# Patient Record
Sex: Male | Born: 1985 | Race: White | Hispanic: No | Marital: Single | Smoking: Current every day smoker
Health system: Southern US, Community
[De-identification: ages and names within clinical notes are randomized; demographics above are authoritative.]

## PROBLEM LIST (undated history)

## (undated) ENCOUNTER — Emergency Department (HOSPITAL_COMMUNITY): Payer: Self-pay

---
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
## 2007-01-10 ENCOUNTER — Emergency Department: Payer: Self-pay | Admitting: Emergency Medicine

## 2008-09-05 ENCOUNTER — Emergency Department: Payer: Self-pay | Admitting: Emergency Medicine

## 2008-12-24 ENCOUNTER — Emergency Department: Payer: Self-pay | Admitting: Emergency Medicine

## 2009-04-15 ENCOUNTER — Emergency Department: Payer: Self-pay | Admitting: Emergency Medicine

## 2013-07-13 ENCOUNTER — Emergency Department: Payer: Self-pay | Admitting: Emergency Medicine

## 2013-07-13 LAB — BASIC METABOLIC PANEL
Anion Gap: 8 (ref 7–16)
Calcium, Total: 10.3 mg/dL — ABNORMAL HIGH (ref 8.5–10.1)
Chloride: 101 mmol/L (ref 98–107)
Co2: 26 mmol/L (ref 21–32)
Creatinine: 1.07 mg/dL (ref 0.60–1.30)
EGFR (African American): >60
Glucose: 96 mg/dL (ref 65–99)
Potassium: 2.9 mmol/L — ABNORMAL LOW (ref 3.5–5.1)
Sodium: 135 mmol/L — ABNORMAL LOW (ref 136–145)

## 2013-07-13 LAB — CBC
HGB: 16.1 g/dL (ref 13.0–18.0)
MCH: 29.8 pg (ref 26.0–34.0)
Platelet: 323 10*3/uL (ref 150–440)
RBC: 5.39 10*6/uL (ref 4.40–5.90)
RDW: 13.8 % (ref 11.5–14.5)

## 2013-07-13 LAB — URINALYSIS, COMPLETE
Bacteria: NONE SEEN
Bilirubin,UR: NEGATIVE
Ketone: NEGATIVE
Leukocyte Esterase: NEGATIVE
Nitrite: NEGATIVE
Protein: NEGATIVE
RBC,UR: 1 /HPF (ref 0–5)
Specific Gravity: 1.004 (ref 1.003–1.030)
Squamous Epithelial: NONE SEEN
WBC UR: 1 /HPF (ref 0–5)

## 2014-10-27 ENCOUNTER — Emergency Department: Payer: Self-pay | Admitting: Internal Medicine

## 2015-12-31 ENCOUNTER — Encounter: Payer: Self-pay | Admitting: Emergency Medicine

## 2015-12-31 ENCOUNTER — Emergency Department
Admission: EM | Admit: 2015-12-31 | Discharge: 2015-12-31 | Disposition: A | Discharge status: Home / Self Care | Payer: Self-pay | Attending: Emergency Medicine | Admitting: Emergency Medicine

## 2015-12-31 DIAGNOSIS — R319 Hematuria, unspecified: Secondary | ICD-10-CM | POA: Insufficient documentation

## 2015-12-31 LAB — URINALYSIS COMPLETE WITH MICROSCOPIC (ARMC ONLY)
BACTERIA UA: NONE SEEN
BILIRUBIN URINE: NEGATIVE
Glucose, UA: NEGATIVE mg/dL
Hgb urine dipstick: NEGATIVE
KETONES UR: NEGATIVE mg/dL
Leukocytes, UA: NEGATIVE
Nitrite: NEGATIVE
PH: 7 (ref 5.0–8.0)
PROTEIN: 30 mg/dL — AB
SPECIFIC GRAVITY, URINE: 1.034 — AB (ref 1.005–1.030)
SQUAMOUS EPITHELIAL / LPF: NONE SEEN

## 2015-12-31 LAB — COMPREHENSIVE METABOLIC PANEL
ALBUMIN: 5.1 g/dL — AB (ref 3.5–5.0)
ALT: 25 U/L (ref 17–63)
AST: 28 U/L (ref 15–41)
Alkaline Phosphatase: 51 U/L (ref 38–126)
Anion gap: 6 (ref 5–15)
BUN: 16 mg/dL (ref 6–20)
CHLORIDE: 104 mmol/L (ref 101–111)
CO2: 27 mmol/L (ref 22–32)
CREATININE: 0.98 mg/dL (ref 0.61–1.24)
Calcium: 9.8 mg/dL (ref 8.9–10.3)
GFR calc Af Amer: >60 mL/min (ref >60)
GFR calc non Af Amer: >60 mL/min (ref >60)
GLUCOSE: 113 mg/dL — AB (ref 65–99)
POTASSIUM: 3.9 mmol/L (ref 3.5–5.1)
Sodium: 137 mmol/L (ref 135–145)
Total Bilirubin: 1.1 mg/dL (ref 0.3–1.2)
Total Protein: 8.1 g/dL (ref 6.5–8.1)

## 2015-12-31 LAB — CBC
HEMATOCRIT: 45.7 % (ref 40.0–52.0)
Hemoglobin: 15.8 g/dL (ref 13.0–18.0)
MCH: 30.8 pg (ref 26.0–34.0)
MCHC: 34.6 g/dL (ref 32.0–36.0)
MCV: 89 fL (ref 80.0–100.0)
Platelets: 239 10*3/uL (ref 150–440)
RBC: 5.14 MIL/uL (ref 4.40–5.90)
RDW: 12.8 % (ref 11.5–14.5)
WBC: 8.7 10*3/uL (ref 3.8–10.6)

## 2015-12-31 LAB — LIPASE, BLOOD: LIPASE: 21 U/L (ref 11–51)

## 2015-12-31 NOTE — ED Notes (Signed)
Pt to ed with c/o abd pain, groin pain x 1 month.  Pt states last night noticed blood colored urine.

## 2015-12-31 NOTE — ED Notes (Signed)
Called to be taken to ED bed. No response.

## 2015-12-31 NOTE — ED Notes (Signed)
Called to room pt, no response. 

## 2018-02-24 ENCOUNTER — Emergency Department
Admission: EM | Admit: 2018-02-24 | Discharge: 2018-02-25 | Disposition: A | Discharge status: Other Acute Inpatient Hospital | Payer: Self-pay | Attending: Emergency Medicine | Admitting: Emergency Medicine

## 2018-02-24 ENCOUNTER — Encounter: Payer: Self-pay | Admitting: Emergency Medicine

## 2018-02-24 DIAGNOSIS — T1491XA Suicide attempt, initial encounter: Secondary | ICD-10-CM

## 2018-02-24 DIAGNOSIS — Z046 Encounter for general psychiatric examination, requested by authority: Secondary | ICD-10-CM | POA: Insufficient documentation

## 2018-02-24 DIAGNOSIS — Z7289 Other problems related to lifestyle: Secondary | ICD-10-CM | POA: Insufficient documentation

## 2018-02-24 DIAGNOSIS — F17200 Nicotine dependence, unspecified, uncomplicated: Secondary | ICD-10-CM | POA: Insufficient documentation

## 2018-02-24 DIAGNOSIS — F329 Major depressive disorder, single episode, unspecified: Secondary | ICD-10-CM | POA: Insufficient documentation

## 2018-02-24 DIAGNOSIS — F10920 Alcohol use, unspecified with intoxication, uncomplicated: Secondary | ICD-10-CM | POA: Insufficient documentation

## 2018-02-24 DIAGNOSIS — F322 Major depressive disorder, single episode, severe without psychotic features: Secondary | ICD-10-CM

## 2018-02-24 DIAGNOSIS — Y906 Blood alcohol level of 120-199 mg/100 ml: Secondary | ICD-10-CM | POA: Insufficient documentation

## 2018-02-24 DIAGNOSIS — R45851 Suicidal ideations: Secondary | ICD-10-CM | POA: Insufficient documentation

## 2018-02-24 DIAGNOSIS — F1010 Alcohol abuse, uncomplicated: Secondary | ICD-10-CM

## 2018-02-24 DIAGNOSIS — F1410 Cocaine abuse, uncomplicated: Secondary | ICD-10-CM

## 2018-02-24 NOTE — ED Provider Notes (Signed)
[facility] Emergency Department Provider Note   ____________________________________________   First MD Initiated Contact with Patient 02/24/18 2314     (approximate)  I have reviewed the triage vital signs and the nursing notes.   HISTORY  Chief Complaint Alcohol Intoxication (Pt. to ED with C/O SI and alcohol intoxication)    HPI James Mercado is a 32 y.o. male who comes into the hospital today stating that he does not want to be here.  He states the people leave him alone.  When I asked him if he tried to kill himself he states may be.  He reports that he tried to overdose on Percocet 2 to 3 days ago.  He reports that he took 60 pills.  He then states that tonight he drank too much and tried to strangle himself.  He reports that he did not drink a lot but then states that he drank all day.  He denies any drug use recently but he has used in the past.  He states that he is never tried to kill himself before and according to the nurse his suicide attempt was in relation to the patient's girlfriend breaking up with him.  He states that he does not want to be here.  He is here today for evaluation.  No past medical history on file.  The patient refuses to answer regarding his past medical and surgical history.  There are no active problems to display for this patient.   No past surgical history on file.  Prior to Admission medications   Not on File    Allergies Motrin [ibuprofen]  No family history on file.  Social History Social History   Tobacco Use  . Smoking status: Current Every Day Smoker  Substance Use Topics  . Alcohol use: Yes  . Drug use: Yes    Types: Marijuana    Review of Systems  Constitutional: No fever/chills Eyes: No visual changes. ENT: No sore throat. Cardiovascular: Denies chest pain. Respiratory: Denies shortness of breath. Gastrointestinal: No abdominal pain.  No nausea, no vomiting.  No diarrhea.  No  constipation. Genitourinary: Negative for dysuria. Musculoskeletal: Negative for back pain. Skin: Negative for rash. Neurological: Negative for headaches, focal weakness or numbness. Psychiatric:Flat affect and somnolent, suicidal ideation   ____________________________________________   PHYSICAL EXAM:  VITAL SIGNS: ED Triage Vitals  Enc Vitals Group     BP 02/24/18 2255 133/85     Pulse Rate 02/24/18 2255 88     Resp 02/24/18 2255 18     Temp 02/24/18 2315 98.2 F (36.8 C)     Temp Source 02/24/18 2315 Oral     SpO2 02/24/18 2255 98 %     Weight --      Height --      Head Circumference --      Peak Flow --      Pain Score --      Pain Loc --      Pain Edu? --      Excl. in GC? --     Constitutional: Somnolent but arousable, slow to answer questions. Well appearing and in mild distress. Eyes: Conjunctivae are normal. PERRL. EOMI. Head: Atraumatic. Nose: No congestion/rhinnorhea. Mouth/Throat: Mucous membranes are moist.  Oropharynx non-erythematous. Cardiovascular: Normal rate, regular rhythm. Grossly normal heart sounds.  Good peripheral circulation. Respiratory: Normal respiratory effort.  No retractions. Lungs CTAB. Gastrointestinal: Soft and nontender. No distention. Positive bowel sounds Musculoskeletal: No lower extremity tenderness nor edema.  Neurologic: Slurred speech, somnolent Skin:  Skin is warm, dry and intact. No rash noted. Psychiatric: Flat affect and depressed mood.  ____________________________________________   LABS (all labs ordered are listed, but only abnormal results are displayed)  Labs Reviewed  ACETAMINOPHEN LEVEL - Abnormal; Notable for the following components:      Result Value   Acetaminophen (Tylenol), Serum <10 (*)    All other components within normal limits  ETHANOL - Abnormal; Notable for the following components:   Alcohol, Ethyl (B) 161 (*)    All other components within normal limits  CBC - Abnormal; Notable for the  following components:   WBC 11.8 (*)    All other components within normal limits  URINE DRUG SCREEN, QUALITATIVE ([facility] ONLY) - Abnormal; Notable for the following components:   Cocaine Metabolite,Ur [place] POSITIVE (*)    Cannabinoid 50 Ng, Ur [place] POSITIVE (*)    Barbiturates, Ur Screen   (*)    Value: Result not available. Reagent lot number recalled by manufacturer.   All other components within normal limits  SALICYLATE LEVEL  COMPREHENSIVE METABOLIC PANEL   ____________________________________________  EKG  none ____________________________________________  RADIOLOGY  ED MD interpretation:  none  Official radiology report(s): No results found.  ____________________________________________   PROCEDURES  Procedure(s) performed: None  Procedures  Critical Care performed: No  ____________________________________________   INITIAL IMPRESSION / ASSESSMENT AND PLAN / ED COURSE  As part of my medical decision making, I reviewed the following data within the electronic MEDICAL RECORD NUMBER Notes from prior ED visits and [place] Controlled Substance Database   This is a 32 year old male who comes into the hospital today after trying to kill himself.  The patient tried to strangle himself after drinking.  He does not have any ligature marks around his neck.  He also reports that he took 60 Percocet 2 to 3 days ago.  We will check some blood work on the patient including a salicylate and acetaminophen level.  We will reassess the patient once I received all of his results and he will be evaluated by psych.  The patient's alcohol level is 161 but he does have some cocaine and marijuana in his urine.  The patient's remaining blood work is unremarkable.  His acetaminophen level is less than 10.  We will have the patient evaluated by psych and TTS.     ____________________________________________   FINAL CLINICAL IMPRESSION(S) / ED DIAGNOSES  Final diagnoses:  Alcoholic  intoxication without complication (HCC)  Suicidal ideation     ED Discharge Orders    None       Note:  This document was prepared using Dragon voice recognition software and may include unintentional dictation errors.    Rebecka Apley, MD 02/25/18 [phone]

## 2018-02-24 NOTE — ED Notes (Signed)
Unable to get oral temperature at this time.

## 2018-02-24 NOTE — ED Triage Notes (Signed)
Pt. Brought in by [facility] from behind a bar where he was found to have a belt around his neck. Patient states that his girlfriend dumped him and he wanted to hurt himself. Pt. Denies HI and AVH.

## 2018-02-25 ENCOUNTER — Other Ambulatory Visit: Payer: Self-pay

## 2018-02-25 ENCOUNTER — Inpatient Hospital Stay
Admission: AD | Admit: 2018-02-25 | Discharge: 2018-03-04 | DRG: 885 | Disposition: A | Discharge status: Home / Self Care | Payer: No Typology Code available for payment source | Attending: Psychiatry | Admitting: Psychiatry

## 2018-02-25 DIAGNOSIS — G4700 Insomnia, unspecified: Secondary | ICD-10-CM | POA: Diagnosis present

## 2018-02-25 DIAGNOSIS — F1010 Alcohol abuse, uncomplicated: Secondary | ICD-10-CM | POA: Diagnosis present

## 2018-02-25 DIAGNOSIS — F419 Anxiety disorder, unspecified: Secondary | ICD-10-CM | POA: Diagnosis present

## 2018-02-25 DIAGNOSIS — Z886 Allergy status to analgesic agent status: Secondary | ICD-10-CM | POA: Diagnosis not present

## 2018-02-25 DIAGNOSIS — F322 Major depressive disorder, single episode, severe without psychotic features: Secondary | ICD-10-CM

## 2018-02-25 DIAGNOSIS — T1491XA Suicide attempt, initial encounter: Secondary | ICD-10-CM

## 2018-02-25 DIAGNOSIS — F1290 Cannabis use, unspecified, uncomplicated: Secondary | ICD-10-CM | POA: Diagnosis present

## 2018-02-25 DIAGNOSIS — F1410 Cocaine abuse, uncomplicated: Secondary | ICD-10-CM

## 2018-02-25 DIAGNOSIS — F17200 Nicotine dependence, unspecified, uncomplicated: Secondary | ICD-10-CM | POA: Diagnosis present

## 2018-02-25 DIAGNOSIS — Y906 Blood alcohol level of 120-199 mg/100 ml: Secondary | ICD-10-CM | POA: Diagnosis present

## 2018-02-25 LAB — COMPREHENSIVE METABOLIC PANEL
ALT: 27 U/L (ref 0–44)
AST: 26 U/L (ref 15–41)
Albumin: 4.3 g/dL (ref 3.5–5.0)
Alkaline Phosphatase: 65 U/L (ref 38–126)
Anion gap: 10 (ref 5–15)
BILIRUBIN TOTAL: 0.5 mg/dL (ref 0.3–1.2)
BUN: 9 mg/dL (ref 6–20)
CALCIUM: 9.4 mg/dL (ref 8.9–10.3)
CO2: 24 mmol/L (ref 22–32)
CREATININE: 0.88 mg/dL (ref 0.61–1.24)
Chloride: 104 mmol/L (ref 98–111)
GFR calc non Af Amer: >60 mL/min (ref >60)
GLUCOSE: 93 mg/dL (ref 70–99)
Potassium: 3.5 mmol/L (ref 3.5–5.1)
SODIUM: 138 mmol/L (ref 135–145)
Total Protein: 7.7 g/dL (ref 6.5–8.1)

## 2018-02-25 LAB — URINE DRUG SCREEN, QUALITATIVE (ARMC ONLY)
Amphetamines, Ur Screen: NOT DETECTED
BENZODIAZEPINE, UR SCRN: NOT DETECTED
CANNABINOID 50 NG, UR ~~LOC~~: POSITIVE — AB
Cocaine Metabolite,Ur ~~LOC~~: POSITIVE — AB
MDMA (ECSTASY) UR SCREEN: NOT DETECTED
Methadone Scn, Ur: NOT DETECTED
Opiate, Ur Screen: NOT DETECTED
PHENCYCLIDINE (PCP) UR S: NOT DETECTED
TRICYCLIC, UR SCREEN: NOT DETECTED

## 2018-02-25 LAB — SALICYLATE LEVEL: Salicylate Lvl: <7 mg/dL (ref 2.8–30.0)

## 2018-02-25 LAB — CBC
HCT: 50.1 % (ref 40.0–52.0)
Hemoglobin: 17.3 g/dL (ref 13.0–18.0)
MCH: 31.9 pg (ref 26.0–34.0)
MCHC: 34.5 g/dL (ref 32.0–36.0)
MCV: 92.3 fL (ref 80.0–100.0)
PLATELETS: 289 10*3/uL (ref 150–440)
RBC: 5.42 MIL/uL (ref 4.40–5.90)
RDW: 13.4 % (ref 11.5–14.5)
WBC: 11.8 10*3/uL — ABNORMAL HIGH (ref 3.8–10.6)

## 2018-02-25 LAB — ACETAMINOPHEN LEVEL: Acetaminophen (Tylenol), Serum: <10 ug/mL — ABNORMAL LOW (ref 10–30)

## 2018-02-25 LAB — ETHANOL: Alcohol, Ethyl (B): 161 mg/dL — ABNORMAL HIGH (ref <10)

## 2018-02-25 MED ORDER — HYDROXYZINE HCL 50 MG PO TABS
50.0000 mg | ORAL_TABLET | Freq: Three times a day (TID) | ORAL | Status: DC | PRN
  Administered 2018-02-25 – 2018-03-03 (×5): 50 mg via ORAL
  Filled 2018-02-25 (×5): qty 1

## 2018-02-25 MED ORDER — ADULT MULTIVITAMIN W/MINERALS CH
1.0000 | ORAL_TABLET | Freq: Every day | ORAL | Status: DC
  Administered 2018-02-25: 1 via ORAL
  Filled 2018-02-25: qty 1

## 2018-02-25 MED ORDER — NICOTINE 21 MG/24HR TD PT24
21.0000 mg | MEDICATED_PATCH | Freq: Once | TRANSDERMAL | Status: DC
Start: 2018-02-25 — End: 2018-02-25
  Administered 2018-02-25: 21 mg via TRANSDERMAL

## 2018-02-25 MED ORDER — VITAMIN B-1 100 MG PO TABS
100.0000 mg | ORAL_TABLET | Freq: Every day | ORAL | Status: DC
  Administered 2018-02-25: 100 mg via ORAL
  Filled 2018-02-25: qty 1

## 2018-02-25 MED ORDER — FOLIC ACID 1 MG PO TABS
1.0000 mg | ORAL_TABLET | Freq: Every day | ORAL | Status: DC
  Administered 2018-02-26 – 2018-03-04 (×7): 1 mg via ORAL
  Filled 2018-02-25 (×7): qty 1

## 2018-02-25 MED ORDER — THIAMINE HCL 100 MG/ML IJ SOLN
100.0000 mg | Freq: Every day | INTRAMUSCULAR | Status: DC
  Filled 2018-02-25 (×7): qty 1

## 2018-02-25 MED ORDER — NICOTINE 21 MG/24HR TD PT24
MEDICATED_PATCH | TRANSDERMAL | Status: AC
  Administered 2018-02-25: 21 mg via TRANSDERMAL
  Filled 2018-02-25: qty 1

## 2018-02-25 MED ORDER — FOLIC ACID 1 MG PO TABS
1.0000 mg | ORAL_TABLET | Freq: Every day | ORAL | Status: DC
  Administered 2018-02-25: 1 mg via ORAL
  Filled 2018-02-25: qty 1

## 2018-02-25 MED ORDER — LORAZEPAM 2 MG/ML IJ SOLN: 1.0000 mg | Freq: Four times a day (QID) | INTRAMUSCULAR | Status: DC | PRN

## 2018-02-25 MED ORDER — LORAZEPAM 1 MG PO TABS: 1.0000 mg | ORAL_TABLET | Freq: Four times a day (QID) | ORAL | Status: AC | PRN

## 2018-02-25 MED ORDER — ADULT MULTIVITAMIN W/MINERALS CH
1.0000 | ORAL_TABLET | Freq: Every day | ORAL | Status: DC
Start: 2018-02-26 — End: 2018-03-04
  Administered 2018-02-26 – 2018-03-04 (×7): 1 via ORAL
  Filled 2018-02-25 (×7): qty 1

## 2018-02-25 MED ORDER — VITAMIN B-1 100 MG PO TABS
100.0000 mg | ORAL_TABLET | Freq: Every day | ORAL | Status: DC
Start: 2018-02-26 — End: 2018-03-04
  Administered 2018-02-26 – 2018-03-04 (×7): 100 mg via ORAL
  Filled 2018-02-25 (×7): qty 1

## 2018-02-25 MED ORDER — ALUM & MAG HYDROXIDE-SIMETH 200-200-20 MG/5ML PO SUSP: 30.0000 mL | ORAL | Status: DC | PRN

## 2018-02-25 MED ORDER — MAGNESIUM HYDROXIDE 400 MG/5ML PO SUSP: 30.0000 mL | Freq: Every day | ORAL | Status: DC | PRN

## 2018-02-25 MED ORDER — LORAZEPAM 2 MG/ML IJ SOLN: 1.0000 mg | Freq: Four times a day (QID) | INTRAMUSCULAR | Status: AC | PRN

## 2018-02-25 MED ORDER — TRAZODONE HCL 100 MG PO TABS
100.0000 mg | ORAL_TABLET | Freq: Every evening | ORAL | Status: DC | PRN
  Administered 2018-02-25 – 2018-02-26 (×2): 100 mg via ORAL
  Filled 2018-02-25 (×2): qty 1

## 2018-02-25 MED ORDER — ACETAMINOPHEN 325 MG PO TABS: 650.0000 mg | ORAL_TABLET | Freq: Four times a day (QID) | ORAL | Status: DC | PRN

## 2018-02-25 MED ORDER — LORAZEPAM 1 MG PO TABS: 1.0000 mg | ORAL_TABLET | Freq: Four times a day (QID) | ORAL | Status: DC | PRN

## 2018-02-25 MED ORDER — THIAMINE HCL 100 MG/ML IJ SOLN: 100.0000 mg | Freq: Every day | INTRAMUSCULAR | Status: DC

## 2018-02-25 NOTE — ED Notes (Signed)
Hourly rounding reveals patient sleeping in room. No complaints, stable, in no acute distress. Q15 minute rounds and monitoring via Rover and Officer to continue.  

## 2018-02-25 NOTE — ED Notes (Signed)
Calvin, TTS at bedside at this time. 2 gray earrings, and 1 gray eyebrow ring removed from patient. Placed in specimen cup at this time with patient label.

## 2018-02-25 NOTE — ED Notes (Signed)
Pt is up and walking around room.

## 2018-02-25 NOTE — Plan of Care (Signed)
New admission

## 2018-02-25 NOTE — Consult Note (Signed)
[facility] Consult   Reason for Consult: Consult for 32 year old man with a history of alcohol and cocaine abuse who came to the hospital after trying to kill himself Referring Physician: Jimmye Norman Patient Identification: James Mercado MRN:  [id number] Principal Diagnosis: Suicide attempt [facility]) Diagnosis:   Patient Active Problem List   Diagnosis Date Noted  . Suicide attempt ([place]) [T14.91XA] 02/25/2018  . Severe major depression, single episode, without psychotic features ([place]) [F32.2] 02/25/2018  . Alcohol abuse [F10.10] 02/25/2018  . Cocaine abuse ([place]) [F14.10] 02/25/2018    Total Time spent with patient: 1 hour  Subjective:   James Mercado is a 32 y.o. male patient admitted with "I tried to strangle myself".  HPI: Patient seen chart reviewed.  32 year old man brought in by emergency services after being found near a local bar with his belt wrapped around his neck.  Patient has been largely cooperative since coming to the emergency room.  He tells me that he tried to strangle himself yesterday.  He has only very vague memories of it.  He knows that he has felt like he is "tired of living" and that he "does not want to be here".  He admits that he was drinking heavily yesterday estimating his alcohol intake as being a case of beer and several drinks of liquor.  His alcohol level was only 161 on presentation.  He also admits to fairly regular use of cocaine and alcohol and marijuana.  Mood has been depressed for a few months.  Stays down and negative most of the time.  Isolated when he is not working.  Sleep is chronically very poor.  Appetite decreased.  Denies any hallucinations.  Major life stressor was his girlfriend leaving him a couple months ago.  Minimal to no real support system.  Medical history: Patient says he has had seizures in the past.  We cannot find any record in the computer of it right now although he has had ER visits in the past for which we lack  some documentation.  He claims that no one has ever been able to find a cause for the seizures and he is not on any medication for them.  Substance abuse history: Long-standing alcohol abuse.  Estimates that his usual daily consumption is at least a 12 pack.  Admits to regular use of cocaine and marijuana.  Acknowledges that it is a problem but has not been involved in treatment.  Social history: Patient is not willing to share much information about this.  He says he does not stay in touch with any of his family.  He had a girlfriend with 2 young children but she left him a couple months ago.  Claims to have no idea why this happened.  Mostly just lives with people with whom he works.  Past Psychiatric History: Patient has apparently never seen a psychiatrist or mental health provider before.  No hospitalizations.  He is vague as to whether he has ever tried to hurt himself in the past.  Never been prescribed any psychiatric medicine.  No known history of psychosis.  Risk to Self: Suicidal Ideation: Yes-Currently Present Suicidal Intent: Yes-Currently Present Is patient at risk for suicide?: Yes Suicidal Plan?: Yes-Currently Present Specify Current Suicidal Plan: Overdose and Strangle self Access to Means: Yes Specify Access to Suicidal Means: Belt What has been your use of drugs/alcohol within the last 12 months?: Alcohol, Cocaine & Cannabis How many times?: 2 Other Self Harm Risks: Active Addiction Triggers for Past Attempts:  None known Intentional Self Injurious Behavior: None Risk to Others: Homicidal Ideation: No Thoughts of Harm to Others: No Current Homicidal Intent: No Current Homicidal Plan: No Access to Homicidal Means: No Identified Victim: Reports of none History of harm to others?: No Assessment of Violence: None Noted Violent Behavior Description: Reports of none Does patient have access to weapons?: No Criminal Charges Pending?: No Does patient have a court date:  No Prior Inpatient Therapy: Prior Inpatient Therapy: No Prior Outpatient Therapy: Prior Outpatient Therapy: No Does patient have an ACCT team?: No Does patient have Intensive In-House Services?  : No Does patient have [facility] services? : No Does patient have P4CC services?: No  Past Medical History: No past medical history on file. No past surgical history on file. Family History: No family history on file. Family Psychiatric  History: Does not know of any Social History:  Social History   Substance and Sexual Activity  Alcohol Use Yes     Social History   Substance and Sexual Activity  Drug Use Yes  . Types: Marijuana    Social History   Socioeconomic History  . Marital status: Single    Spouse name: Not on file  . Number of children: Not on file  . Years of education: Not on file  . Highest education level: Not on file  Occupational History  . Not on file  Social Needs  . Financial resource strain: Not on file  . Food insecurity:    Worry: Not on file    Inability: Not on file  . Transportation needs:    Medical: Not on file    Non-medical: Not on file  Tobacco Use  . Smoking status: Current Every Day Smoker  Substance and Sexual Activity  . Alcohol use: Yes  . Drug use: Yes    Types: Marijuana  . Sexual activity: Not on file  Lifestyle  . Physical activity:    Days per week: Not on file    Minutes per session: Not on file  . Stress: Not on file  Relationships  . Social connections:    Talks on phone: Not on file    Gets together: Not on file    Attends religious service: Not on file    Active member of club or organization: Not on file    Attends meetings of clubs or organizations: Not on file    Relationship status: Not on file  Other Topics Concern  . Not on file  Social History Narrative  . Not on file   Additional Social History:    Allergies:   Allergies  Allergen Reactions  . Motrin [Ibuprofen] Rash    Labs:  Results for orders  placed or performed during the hospital encounter of 02/24/18 (from the past 48 hour(s))  Acetaminophen level     Status: Abnormal   Collection Time: 02/24/18 10:51 PM  Result Value Ref Range   Acetaminophen (Tylenol), Serum <10 (L) 10 - 30 ug/mL    Comment: (NOTE) Therapeutic concentrations vary significantly. A range of 10-30 ug/mL  may be an effective concentration for many patients. However, some  are best treated at concentrations outside of this range. Acetaminophen concentrations >150 ug/mL at 4 hours after ingestion  and >50 ug/mL at 12 hours after ingestion are often associated with  toxic reactions. Performed at [facility], [address] [place], [place] [address]   Salicylate level     Status: None   Collection Time: 02/24/18 10:51 PM  Result  Value Ref Range   Salicylate Lvl <4.1 2.8 - 30.0 mg/dL    Comment: Performed at [facility], [address] [place], [place] [address]  Comprehensive metabolic panel     Status: None   Collection Time: 02/24/18 10:51 PM  Result Value Ref Range   Sodium 138 135 - 145 mmol/L   Potassium 3.5 3.5 - 5.1 mmol/L   Chloride 104 98 - 111 mmol/L    Comment: Please note change in reference range.   CO2 24 22 - 32 mmol/L   Glucose, Bld 93 70 - 99 mg/dL    Comment: Please note change in reference range.   BUN 9 6 - 20 mg/dL    Comment: Please note change in reference range.   Creatinine, Ser 0.88 0.61 - 1.24 mg/dL   Calcium 9.4 8.9 - 10.3 mg/dL   Total Protein 7.7 6.5 - 8.1 g/dL   Albumin 4.3 3.5 - 5.0 g/dL   AST 26 15 - 41 U/L   ALT 27 0 - 44 U/L    Comment: Please note change in reference range.   Alkaline Phosphatase 65 38 - 126 U/L   Total Bilirubin 0.5 0.3 - 1.2 mg/dL   GFR calc non Af Amer >60 >60 mL/min   GFR calc Af Amer >60 >60 mL/min    Comment: (NOTE) The eGFR has been calculated using the CKD EPI equation. This calculation has not been validated in all clinical situations. eGFR's persistently <60  mL/min signify possible Chronic Kidney Disease.    Anion gap 10 5 - 15    Comment: Performed at [facility], [address] [place], [place] [address]  Ethanol     Status: Abnormal   Collection Time: 02/24/18 10:51 PM  Result Value Ref Range   Alcohol, Ethyl (B) 161 (H) <10 mg/dL    Comment: (NOTE) Lowest detectable limit for serum alcohol is 10 mg/dL. For medical purposes only. Performed at [facility], [address] [place], [place] [address]   cbc     Status: Abnormal   Collection Time: 02/24/18 10:51 PM  Result Value Ref Range   WBC 11.8 (H) 3.8 - 10.6 K/uL   RBC 5.42 4.40 - 5.90 MIL/uL   Hemoglobin 17.3 13.0 - 18.0 g/dL   HCT 50.1 40.0 - 52.0 %   MCV 92.3 80.0 - 100.0 fL   MCH 31.9 26.0 - 34.0 pg   MCHC 34.5 32.0 - 36.0 g/dL   RDW 13.4 11.5 - 14.5 %   Platelets 289 150 - 440 K/uL    Comment: Performed at [facility], [address] [place], [place] [address]  Urine Drug Screen, Qualitative     Status: Abnormal   Collection Time: 02/24/18 10:51 PM  Result Value Ref Range   Tricyclic, Ur Screen NONE DETECTED NONE DETECTED   Amphetamines, Ur Screen NONE DETECTED NONE DETECTED   MDMA (Ecstasy)Ur Screen NONE DETECTED NONE DETECTED   Cocaine Metabolite,Ur [place] POSITIVE (A) NONE DETECTED   Opiate, Ur Screen NONE DETECTED NONE DETECTED   Phencyclidine (PCP) Ur S NONE DETECTED NONE DETECTED   Cannabinoid 50 Ng, Ur [place] POSITIVE (A) NONE DETECTED   Barbiturates, Ur Screen (A) NONE DETECTED    Result not available. Reagent lot number recalled by manufacturer.   Benzodiazepine, Ur Scrn NONE DETECTED NONE DETECTED   Methadone Scn, Ur NONE DETECTED NONE DETECTED    Comment: (NOTE) Tricyclics + metabolites, urine    Cutoff 1000 ng/mL Amphetamines + metabolites,  urine  Cutoff 1000 ng/mL MDMA (Ecstasy), urine              Cutoff 500 ng/mL Cocaine Metabolite, urine          Cutoff 300 ng/mL Opiate + metabolites, urine        Cutoff 300  ng/mL Phencyclidine (PCP), urine         Cutoff 25 ng/mL Cannabinoid, urine                 Cutoff 50 ng/mL Barbiturates + metabolites, urine  Cutoff 200 ng/mL Benzodiazepine, urine              Cutoff 200 ng/mL Methadone, urine                   Cutoff 300 ng/mL The urine drug screen provides only a preliminary, unconfirmed analytical test result and should not be used for non-medical purposes. Clinical consideration and professional judgment should be applied to any positive drug screen result due to possible interfering substances. A more specific alternate chemical method must be used in order to obtain a confirmed analytical result. Gas chromatography / mass spectrometry (GC/MS) is the preferred confirmat ory method. Performed at [facility], [address] [place], [place] [address]     Current Facility-Administered Medications  Medication Dose Route Frequency Provider Last Rate Last Dose  . nicotine (NICODERM CQ - dosed in mg/24 hours) patch 21 mg  21 mg Transdermal Once Earleen Newport, MD   21 mg at 02/25/18 1102   No current outpatient medications on file.    Musculoskeletal: Strength & Muscle Tone: within normal limits Gait & Station: normal Patient leans: N/A  Psychiatric Specialty Exam: Physical Exam  Nursing note and vitals reviewed. Constitutional: He appears well-developed and well-nourished.  HENT:  Head: Normocephalic and atraumatic.  Eyes: Pupils are equal, round, and reactive to light. Conjunctivae are normal.  Neck: Normal range of motion.  Cardiovascular: Regular rhythm and normal heart sounds.  Respiratory: Effort normal. No respiratory distress.  GI: Soft.  Musculoskeletal: Normal range of motion.  Neurological: He is alert.  Skin: Skin is warm and dry.  Psychiatric: His affect is blunt. His speech is delayed. He is slowed. Thought content is not paranoid. Cognition and memory are impaired. He expresses impulsivity. He exhibits a  depressed mood. He expresses suicidal ideation. He expresses no homicidal ideation. He expresses no suicidal plans. He exhibits abnormal recent memory.    Review of Systems  Constitutional: Negative.   HENT: Negative.   Eyes: Negative.   Respiratory: Negative.   Cardiovascular: Negative.   Gastrointestinal: Negative.   Musculoskeletal: Negative.   Skin: Negative.   Neurological: Negative.   Psychiatric/Behavioral: Positive for depression, memory loss, substance abuse and suicidal ideas. The patient is nervous/anxious and has insomnia.     Blood pressure 133/85, pulse 88, temperature 98.2 F (36.8 C), temperature source Oral, resp. rate 18, SpO2 98 %.There is no height or weight on file to calculate BMI.  General Appearance: Disheveled  Eye Contact:  Fair  Speech:  Slow  Volume:  Decreased  Mood:  Dysphoric  Affect:  Depressed  Thought Process:  Coherent  Orientation:  Full (Time, Place, and Person)  Thought Content:  Logical  Suicidal Thoughts:  Yes.  with intent/plan  Homicidal Thoughts:  No  Memory:  Immediate;   Fair Recent;   Fair Remote;   Fair  Judgement:  Fair  Insight:  Fair  Psychomotor Activity:  Decreased  Concentration:  Concentration: Fair  Recall:  [employer] of Knowledge:  Fair  Language:  Fair  Akathisia:  No  Handed:  Right  AIMS (if indicated):     Assets:  Physical Health  ADL's:  Intact  Cognition:  Impaired,  Mild  Sleep:        Treatment Plan Summary: Plan 32 year old man who tried to strangle himself last night.  Continues to appear very depressed.  He is drinking heavily and has no social support and no history of any mental health treatment.  Very blunt and blank looking.  Patient will be admitted to the psychiatric ward because of high risk of self injury.  Detox protocol although at this point he is not showing any outward signs of alcohol withdrawal.  Full treatment team can assess appropriateness of medication treatment.  Continue  individual groups and counseling.  Case reviewed with ER doctor and TTS.  Disposition: Recommend psychiatric Inpatient admission when medically cleared. Supportive therapy provided about ongoing stressors.  Alethia Berthold, MD 02/25/2018 12:43 PM

## 2018-02-25 NOTE — BH Assessment (Signed)
[facility] by Dr. Toni Amendlapacs.  Attending Physician will be Dr. Flora Lipps'Neal.   Patient has been [facility] to room 306, [facility] Gwen.   ER staff is aware of the admission:  Glenda, ER Kathrynn SpeedSectary   Dr. Mayford KnifeWilliams, ER MD   Nicole Cellaorothy, Patient's Nurse   Ethelene BrownsAnthony, Patient Access.

## 2018-02-25 NOTE — ED Notes (Signed)
Discharge teaching done and pt verbalized understanding. Pt signed discharge form. Pt transported with all his personal belongings to behavioral med unit. Escorted by security in [place].

## 2018-02-25 NOTE — Tx Team (Signed)
Initial Treatment Plan 02/25/2018 6:29 PM James MilianPhillip Mercado [id number]    PATIENT STRESSORS: Loss of significant relationship Substance abuse   PATIENT STRENGTHS: Ability for insight Average or above average intelligence General fund of knowledge Motivation for treatment/growth Supportive family/friends   PATIENT IDENTIFIED PROBLEMS: Family issues   Substance abuse                   DISCHARGE CRITERIA:  Ability to meet basic life and health needs Improved stabilization in mood, thinking, and/or behavior Reduction of life-threatening or endangering symptoms to within safe limits  PRELIMINARY DISCHARGE PLAN: Return to previous living arrangement Return to previous work or school arrangements  PATIENT/FAMILY INVOLVEMENT: This treatment plan has been presented to and reviewed with the patient, James Mercado.  The patient family has been given the opportunity to ask questions and make suggestions.  Chamberlain Steinborn, RN 02/25/2018, 6:29 PM
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
## 2018-02-25 NOTE — ED Notes (Signed)
Pt was given water and ambulated to bathroom.

## 2018-02-25 NOTE — ED Notes (Signed)
Dr. Toni Amendlapacs to bedside at this time. Report given to Nicole Cellaorothy, Charity fundraiserN. Pt will be transferred after interview with Dr. Toni Amendlapacs pending evaluation.

## 2018-02-25 NOTE — ED Notes (Signed)
Pt asking if he can remove IV from arm. This tech ask pt to wait until we have orders to remove it. Pt agreed.

## 2018-02-25 NOTE — Plan of Care (Addendum)
Patient found in common area upon my arrival. Patient is visible and social this evening. Mood and affect are appropriate. Patient is new to unit so progress is minimal. Denies all complaints including pain and SI/HI/AVH. Processing is intact. Eye contact good. Denies S/Sx of WD. Cooperative and compliant with staff direction. Reports eating and voiding adequately. Patient has no scheduled HS medications. Given trazodone and vistaril for sleep with positive results. Q 15 minute checks maintained. Will continue to monitor throughout the shift. Patient slept 7 hours. No apparent distress. Will endorse care to oncoming shift.  Problem: Education: Goal: Knowledge of [facility] Education information/materials will improve Outcome: Progressing   Problem: Activity: Goal: Interest or engagement in activities will improve Outcome: Progressing
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
## 2018-02-25 NOTE — ED Notes (Signed)
Pt remains resting in bed at this time. Breakfast tray placed at bedside at this time by this RN. Will continue to monitor for further patient needs.

## 2018-02-25 NOTE — ED Notes (Signed)
[facility] Statistician[facility] and Officer Johnson. Pt calm and cooperative at this time.

## 2018-02-26 DIAGNOSIS — F322 Major depressive disorder, single episode, severe without psychotic features: Secondary | ICD-10-CM | POA: Diagnosis not present

## 2018-02-26 LAB — LIPID PANEL
CHOL/HDL RATIO: 2.3 ratio
CHOLESTEROL: 150 mg/dL (ref 0–200)
HDL: 65 mg/dL (ref >40)
LDL Cholesterol: 61 mg/dL (ref 0–99)
TRIGLYCERIDES: 118 mg/dL (ref <150)
VLDL: 24 mg/dL (ref 0–40)

## 2018-02-26 LAB — HEMOGLOBIN A1C
Hgb A1c MFr Bld: 5.6 % (ref 4.8–5.6)
MEAN PLASMA GLUCOSE: 114.02 mg/dL

## 2018-02-26 LAB — TSH: TSH: 3.128 u[IU]/mL (ref 0.350–4.500)

## 2018-02-26 MED ORDER — SERTRALINE HCL 25 MG PO TABS
50.0000 mg | ORAL_TABLET | Freq: Every day | ORAL | Status: DC
  Administered 2018-02-27 – 2018-03-04 (×6): 50 mg via ORAL
  Filled 2018-02-26 (×6): qty 2

## 2018-02-26 MED ORDER — NICOTINE 21 MG/24HR TD PT24
21.0000 mg | MEDICATED_PATCH | Freq: Every day | TRANSDERMAL | Status: DC
  Administered 2018-02-26 – 2018-03-04 (×7): 21 mg via TRANSDERMAL
  Filled 2018-02-26 (×7): qty 1

## 2018-02-26 MED ORDER — SERTRALINE HCL 25 MG PO TABS
25.0000 mg | ORAL_TABLET | Freq: Once | ORAL | Status: AC
  Administered 2018-02-26: 25 mg via ORAL
  Filled 2018-02-26: qty 1

## 2018-02-26 NOTE — Progress Notes (Signed)
Recreation Therapy Notes  Date: 02/26/2018  Time: 9:30 am  Location: Craft Room  Behavioral response: Appropriate    Intervention Topic: Goals  Discussion/Intervention:  Group content on today was focused on goals. Patients described what goals are and how they define goals. Individuals expressed how they go about setting goals and reaching them. The group identified how important goals are and if they make short term goals to reach long term goals. Patients described how many goals they work on at a time and what affects them not reaching their goal. Individuals described how much time they put into planning and obtaining their goals. The group participated in the intervention "My Goal Board" and made personal goal boards to help them achieve their goal.  Clinical Observations/Feedback:  Patient came to group late due to unknown reasons. Individual participated in the intervention and was social with peers and staff during group.  Nadyne Gariepy LRT/CTRS         Montasia Chisenhall 02/26/2018 11:32 AM
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
## 2018-02-26 NOTE — Plan of Care (Signed)
Pt. Verbalizes understanding of provided education. Pt. Compliant with medications and unit procedures. Pt. Monitored for safety by staff. Pt. Denies SI/Hi. Pt. Is able to verbally contract for safety.    Problem: Education: Goal: Knowledge of [place] [facility] Education information/materials will improve Outcome: Progressing   Problem: Health Behavior/Discharge Planning: Goal: Compliance with treatment plan for underlying cause of condition will improve Outcome: Progressing   Problem: Safety: Goal: Periods of time without injury will increase Outcome: Progressing

## 2018-02-26 NOTE — [facility] Suicide Risk Assessment (Signed)
[facility] Admission Suicide Risk Assessment   Nursing information obtained from:  Patient Demographic factors:  Male, Caucasian Current Mental Status:  NA Loss Factors:  Loss of significant relationship Historical Factors:  Prior suicide attempts Risk Reduction Factors:  Living with another person, especially a relative  Total Time spent with patient, discussing patient with treatment team, and reviewing his chart: 1 hour   Principal Problem: Severe major depression, single episode, without psychotic features (HCC) Diagnosis:   Patient Active Problem List   Diagnosis Date Noted  . Suicide attempt (HCC) [T14.91XA] 02/25/2018  . Severe major depression, single episode, without psychotic features (HCC) [F32.2] 02/25/2018  . Alcohol abuse [F10.10] 02/25/2018  . Cocaine abuse [facility]) [F14.10] 02/25/2018   Subjective Data:  Patient is a 32 year old Caucasian male with a history of substance abuse and depression who presents after suicide attempt via strangulation.  Patient states that for the past 2 months he has been feeling depressed.  He endorses feelings of hopelessness, guilt, helplessness, and worthlessness.  Patient recalls on 02/24/2018, walking to a bar and drinking alcohol until the point of intoxication.  He states that he was feeling down and had thoughts that he "no longer wanted to live."  He recalls taking his belt from around his waist and tying it around his neck.  Patient states he members passing out, and eventually waking up with someone around him.  He admits that it was a suicide attempt.  Per ED records, the patient also tried to overdose on Percocet 2-3 days prior to his strangulation attempt.   Patient continues to endorse depressive thoughts and symptoms.  He reports that he drinks approximately 12-24 beers per day for the past year.  He states that about 1-2 weeks ago his girlfriend left him without warning.  He has attempted to call her multiple times but has been unable to  reach her.  He states that he and his girlfriend were together for 8 months.  Patient also states that he travels for his job in [place].  He is hoping that one day he will be able to settle down in one town.  Patient denies ever being hospitalized in a psychiatric unit or detox unit.  He denies a history of delirium tremens.  Urine drug screen was positive for cocaine and marijuana.  He admits to smoking marijuana and snorting cocaine prior to admission.  He states that typically he only snorts cocaine about 3 times a year but he smokes marijuana approximately once a month.  Blood alcohol level was 161.  Patient is open to treatment for his substance abuse and depression.  He has never been on any psychotropic medications.  However, patient is open to an antidepressant.     Continued Clinical Symptoms:  Alcohol Use Disorder Identification Test Final Score (AUDIT): 14 The "Alcohol Use Disorders Identification Test", Guidelines for Use in Primary Care, Second Edition.  World Science writer [facility]). Score between 0-7:  no or low risk or alcohol related problems. Score between 8-15:  moderate risk of alcohol related problems. Score between 16-19:  high risk of alcohol related problems. Score 20 or above:  warrants further diagnostic evaluation for alcohol dependence and treatment.   CLINICAL FACTORS:   Depression:   Comorbid alcohol abuse/dependence Hopelessness Impulsivity Insomnia Severe Alcohol/Substance Abuse/Dependencies   Musculoskeletal: Strength & Muscle Tone: within normal limits Gait & Station: normal Patient leans: normal stance  Psychiatric Specialty Exam: Physical Exam  Nursing note and vitals reviewed.    Physical Exam  Nursing note and vitals reviewed. Constitutional: He is oriented to person, place, and time. He appears well-developed and well-nourished.  HENT:  Head: Normocephalic and atraumatic.  Eyes: Pupils are equal, round, and reactive to light.   Cardiovascular: Normal rate and regular rhythm.  Respiratory: Effort normal and breath sounds normal.  GI: Soft. Bowel sounds are normal.  Musculoskeletal: Normal range of motion.  Neurological: He is alert and oriented to person, place, and time.    Review of Systems  Constitutional: Negative for chills, diaphoresis, fever and weight loss.  HENT: Negative for congestion, sinus pain and sore throat.        Runny nose  Eyes: Negative for blurred vision and pain.  Respiratory: Negative for cough, shortness of breath and wheezing.   Cardiovascular: Negative for chest pain and leg swelling.  Gastrointestinal: Negative for abdominal pain, constipation, diarrhea, heartburn, nausea and vomiting.  Genitourinary: Negative for dysuria.  Musculoskeletal: Negative for myalgias.  Skin: Negative for rash.  Neurological: Negative for dizziness, tingling, tremors, seizures, loss of consciousness, weakness and headaches.  Psychiatric/Behavioral: Positive for depression, substance abuse and suicidal ideas. Negative for hallucinations and memory loss. The patient has insomnia. The patient is not nervous/anxious.     Blood pressure 136/90, pulse 88, temperature 97.7 F (36.5 C), temperature source Oral, resp. rate 20, height 6\' 2"  (1.88 m), weight 77.6 kg (171 lb), SpO2 100 %.Body mass index is 21.96 kg/m.  General Appearance: Fairly Groomed and Couple tattoos on his bilateral upper and lower extremities, chest, abdomen, shoulders and back  Eye Contact:  Fair  Speech:  Normal Rate  Volume:  Normal  Mood:  Depressed  Affect:  Flat  Thought Process:  Linear  Orientation:  Full (Time, Place, and Person)  Thought Content:  Logical  Suicidal Thoughts:  Yes.  with intent/plan leading to admission; however, pt denies active thoughts during assessment.  He contracts for safety  Homicidal Thoughts:  No  Memory:  intact  Judgement:  Poor  Insight:  Present  Psychomotor Activity:  Decreased   Concentration:  Concentration: Fair  Recall:  Fair  Fund of Knowledge:  Fair  Language:  Good  Akathisia:  No  Handed:  Right  AIMS (if indicated):   N/A  Assets:  Communication Skills Desire for Improvement Housing Resilience Transportation  ADL's:  Intact  Cognition:  WNL  Sleep:  Number of Hours: 7       COGNITIVE FEATURES THAT CONTRIBUTE TO RISK:  None    SUICIDE RISK:   Severe:  Frequent, intense, and enduring suicidal ideation, specific plan, no subjective intent, but some objective markers of intent (i.e., choice of lethal method), the method is accessible, some limited preparatory behavior, evidence of impaired self-control, severe dysphoria/symptomatology, multiple risk factors present, and few if any protective factors, particularly a lack of social support.  Risk factors: Suicide attempts, depressed mood, hopelessness, recent breakup with girlfriend, lack of familial support, substance abuse Protective factors: Willingness to seek treatment Pt's suicide risk is low while in the hospital due to patient receiving treatment. Also he is denying active suicidal thoughts and contracts for safety.  However, chronic suicide risk is high given patient's recent suicide attempts, substance abuse, loss of relationship/break-up, and depression.     Treatment Plan Summary: Daily contact with patient to assess and evaluate symptoms and progress in treatment, Medication management and Plan:  32 year old male admitted for substance abuse issues, depression, and suicide attempt via strangulation.  Patient has never been treated for depression.  He is open to antidepressant.  Patient also admits to drinking alcohol in excess for the past year.  He reports he drinks 12-24 beers per day for the past year.  Patient denies a history of delirium tremens.  Patient also endorses use of cocaine and intermittent use of opiates.  Patient also smokes marijuana.  Patient has never been in inpatient  or outpatient treatment for illicit drug use or alcohol abuse, other than going to AA meetings with his friends.  Major depressive disorder, severe, without psychotic features-initiate Zoloft 25 mg x1 dose, increase to Zoloft 50 mg if no issues at lower dose.  Risk (including but not limited to serotonin syndrome, allergic reaction, anaphylaxis, death) and benefits discussed with patient he consents to the medication.   Alcohol use disorder, moderate to severe; cocaine use disorder, moderate; cannabis use disorder, moderate; opioid use disorder, mild -CIWA protocol with PRN Ativan -Return for any withdrawal symptoms -Counseling provided; abstinence encouraged; will ask [facility] liaison to talk with patient      Observation Level/Precautions:  15 minute checks  Laboratory:  CBC, reviewed Chemistry Profile, within normal limits TSH 3.128 HbAIC 5.6 UDS + cocaine, THC Blood alcohol level 161 Lipid Panel total cholesterol 150, HDL 65, LDL 61, triglycerides 118 EKG-ordered  Psychotherapy: Group  Medications: See above  Consultations: N/A  Discharge Concerns: We will need follow-up for substance abuse treatment and outpatient psychiatric treatment for depression, medication management  Estimated LOS: 5-7 days       I certify that inpatient services furnished can reasonably be expected to improve the patient's condition.   Hessie KnowsSarita O'Neal, MD 02/26/2018, 6:04 PM

## 2018-02-26 NOTE — Progress Notes (Signed)
Recreation Therapy Notes  Date: 02/26/2018  Time: 3:00pm  Location: Craft room  Behavioral response: N/A  Group Type: Game  Participation level: N/A  Communication: Patient was metting with the Doctor.  Comments: N/A  Sky Primo LRT/CTRS        Trinika Cortese 02/26/2018 3:57 PM

## 2018-02-26 NOTE — Plan of Care (Addendum)
Patient found in common area upon my arrival. Patient is visible and social this evening. Patient is in good spirits this evening. Cooperative and polite. Denies all complaints including SI/HI/AVH. Reports eating and voiding adequately. Given Trazodone and Vistaril for sleep with positive results. Patient has no scheduled HS medications. Compliant with staff direction. Attended group. Q 15 minute checks maintained. Will continue to monitor throughout the shift. Patient slept 5.5 hours. Reports "I didn't sleep good at all." Will endorse care to oncoming shift.  Problem: Education: Goal: Knowledge of [facility] Education information/materials will improve Outcome: Progressing Goal: Emotional status will improve Outcome: Progressing Goal: Mental status will improve Outcome: Progressing Goal: Verbalization of understanding the information provided will improve Outcome: Progressing   Problem: Activity: Goal: Interest or engagement in activities will improve Outcome: Progressing Goal: Sleeping patterns will improve Outcome: Progressing   Problem: Education: Goal: Knowledge of General Education information will improve Outcome: Progressing

## 2018-02-26 NOTE — Progress Notes (Signed)
D: Pt denies SI/HI/AVH. Pt is pleasant and cooperative. Pt. has no Complaints.  Patient interaction minimal, but interacts well with peers. Pt. Reports good insight into why he is here.   A: Q x 15 minute observation checks were completed for safety. Patient was provided with education.  Patient was given medications per MD orders. Patient  was encourage to attend groups, participate in unit activities and continue with plan of care. Pt. Chart and plans of care reviewed. Pt. Given support and encouragement.   R: Patient is complaint with medication and unit procedures. Pt. Attends groups and unit activities. Pt. Monitored with CIWA per MD orders. Pt. Compliant with EKG testing with copy placed on chart for MD review.             Precautionary checks every 15 minutes for safety maintained, room free of safety hazards, patient sustains no injury or falls during this shift.
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
## 2018-02-26 NOTE — Tx Team (Addendum)
Interdisciplinary Treatment and Diagnostic Plan Update  02/26/2018 Time of Session: 1100 James Mercado MRN: [id number]  Principal Diagnosis: <principal problem not specified>  Secondary Diagnoses: Active Problems:   Severe major depression, single episode, without psychotic features (HCC)   Current Medications:  Current Facility-Administered Medications  Medication Dose Route Frequency Provider Last Rate Last Dose  . acetaminophen (TYLENOL) tablet 650 mg  650 mg Oral Q6H PRN Clapacs, John T, MD      . alum & mag hydroxide-simeth (MAALOX/MYLANTA) 200-200-20 MG/5ML suspension 30 mL  30 mL Oral Q4H PRN Clapacs, John T, MD      . folic acid (FOLVITE) tablet 1 mg  1 mg Oral Daily Clapacs, Jackquline Denmark, MD   1 mg at 02/26/18 0858  . hydrOXYzine (ATARAX/VISTARIL) tablet 50 mg  50 mg Oral TID PRN Clapacs, Jackquline Denmark, MD   50 mg at 02/25/18 2114  . LORazepam (ATIVAN) tablet 1 mg  1 mg Oral Q6H PRN Clapacs, Jackquline Denmark, MD       Or  . LORazepam (ATIVAN) injection 1 mg  1 mg Intravenous Q6H PRN Clapacs, John T, MD      . magnesium hydroxide (MILK OF MAGNESIA) suspension 30 mL  30 mL Oral Daily PRN Clapacs, John T, MD      . multivitamin with minerals tablet 1 tablet  1 tablet Oral Daily Clapacs, Jackquline Denmark, MD   1 tablet at 02/26/18 0858  . nicotine (NICODERM CQ - dosed in mg/24 hours) patch 21 mg  21 mg Transdermal Daily Hessie Knows, MD   21 mg at 02/26/18 0906  . thiamine (VITAMIN B-1) tablet 100 mg  100 mg Oral Daily Clapacs, John T, MD   100 mg at 02/26/18 4098   Or  . thiamine (B-1) injection 100 mg  100 mg Intravenous Daily Clapacs, John T, MD      . traZODone (DESYREL) tablet 100 mg  100 mg Oral QHS PRN Clapacs, Jackquline Denmark, MD   100 mg at 02/25/18 2114   PTA Medications: No medications prior to admission.    Patient Stressors: Loss of significant relationship Substance abuse  Patient Strengths: Ability for insight Average or above average intelligence General fund of knowledge Motivation for  treatment/growth Supportive family/friends  Treatment Modalities: Medication Management, Group therapy, Case management,  1 to 1 session with clinician, Psychoeducation, Recreational therapy.   Physician Treatment Plan for Primary Diagnosis: <principal problem not specified> Long Term Goal(s):     Short Term Goals:    Medication Management: Evaluate patient's response, side effects, and tolerance of medication regimen.  Therapeutic Interventions: 1 to 1 sessions, Unit Group sessions and Medication administration.  Evaluation of Outcomes: Progressing  Physician Treatment Plan for Secondary Diagnosis: Active Problems:   Severe major depression, single episode, without psychotic features (HCC)  Long Term Goal(s):     Short Term Goals:       Medication Management: Evaluate patient's response, side effects, and tolerance of medication regimen.  Therapeutic Interventions: 1 to 1 sessions, Unit Group sessions and Medication administration.  Evaluation of Outcomes: Progressing   RN Treatment Plan for Primary Diagnosis: <principal problem not specified> Long Term Goal(s): Knowledge of disease and therapeutic regimen to maintain health will improve  Short Term Goals: Ability to participate in decision making will improve, Ability to identify and develop effective coping behaviors will improve and Compliance with prescribed medications will improve  Medication Management: RN will administer medications as ordered by provider, will assess and evaluate patient's response and provide  education to patient for prescribed medication. RN will report any adverse and/or side effects to prescribing provider.  Therapeutic Interventions: 1 on 1 counseling sessions, Psychoeducation, Medication administration, Evaluate responses to treatment, Monitor vital signs and CBGs as ordered, Perform/monitor CIWA, COWS, AIMS and Fall Risk screenings as ordered, Perform wound care treatments as  ordered.  Evaluation of Outcomes: Progressing   LCSW Treatment Plan for Primary Diagnosis: <principal problem not specified> Long Term Goal(s): Safe transition to appropriate next level of care at discharge, Engage patient in therapeutic group addressing interpersonal concerns.  Short Term Goals: Engage patient in aftercare planning with referrals and resources, Increase emotional regulation and Increase skills for wellness and recovery  Therapeutic Interventions: Assess for all discharge needs, 1 to 1 time with Social worker, Explore available resources and support systems, Assess for adequacy in community support network, Educate family and significant other(s) on suicide prevention, Complete Psychosocial Assessment, Interpersonal group therapy.  Evaluation of Outcomes: Progressing   Progress in Treatment: Attending groups: Yes. Participating in groups: Yes. Taking medication as prescribed: Yes. Toleration medication: Yes. Family/Significant other contact made: No, will contact:  a support if pt provides consent. Patient understands diagnosis: Yes. Discussing patient identified problems/goals with staff: Yes. Medical problems stabilized or resolved: Yes. Denies suicidal/homicidal ideation: Yes. Issues/concerns per patient self-inventory: No. Other: None at this time.   New problem(s) identified: No, Describe:  none at this time.  New Short Term/Long Term Goal(s): Pt will report 0 SI by at least 48 hours prior to discharge and will practice at least two coping skills he can utilize to manage cravings/urges to use cocaine and alcohol.   Patient Goals:  Pt reports his goal for treatment is, "Try to learn to open up more, and work on my anger issues."   Discharge Plan or Barriers:  Pt will be discharged home and will continue tx in the outpatient setting.   Reason for Continuation of Hospitalization: Depression Medication stabilization  Estimated Length of Stay: 5-7  days  Recreational Therapy: Patient Stressors: Relationship  Patient Goal: Patient will identify 3 positive coping skills strategies to use for anger post d/c within 5 recreation therapy group sessions  Attendees: Patient: James Mercado 02/26/2018 11:31 AM  Physician: Dr. Flora Lipps'Neal, MD 02/26/2018 11:31 AM  Nursing: Ace Gins'Yawn Chisem, RN 02/26/2018 11:31 AM  RN Care Manager: 02/26/2018 11:31 AM  Social Worker: Heidi DachKelsey Craig, LCSW 02/26/2018 11:31 AM  Recreational Therapist: Garret ReddishShay Timoth Schara, CTRS-LRT 02/26/2018 11:31 AM  Other: Johny Shearsassandra Jarrett, LCSWA 02/26/2018 11:31 AM  Other: Jake SharkSara Laws, LCSW 02/26/2018 11:31 AM  Other: 02/26/2018 11:31 AM    Scribe for Treatment Team: Heidi DachKelsey Craig, LCSW 02/26/2018 11:31 AM

## 2018-02-27 DIAGNOSIS — F322 Major depressive disorder, single episode, severe without psychotic features: Secondary | ICD-10-CM

## 2018-02-27 MED ORDER — TRAZODONE HCL 100 MG PO TABS
150.0000 mg | ORAL_TABLET | Freq: Every evening | ORAL | Status: DC | PRN
  Administered 2018-02-27 – 2018-03-03 (×5): 150 mg via ORAL
  Filled 2018-02-27 (×5): qty 2

## 2018-02-27 NOTE — Progress Notes (Signed)
Recreation Therapy Notes  Date: 02/27/2018  Time: 3:00pm  Location: Craft room  Behavioral response: Appropriate  Group Type: Craft  Participation level: Active  Communication: Patient was social with peers and staff.  Comments: N/A  [name] LRT/CTRS        James Mercado 02/27/2018 4:04 PM 

## 2018-02-27 NOTE — Progress Notes (Signed)
Recreation Therapy Notes  INPATIENT RECREATION THERAPY ASSESSMENT  Patient Details Name: James Mercado MRN: [id number] DOB: Jul 04, 1986 Today's Date: 02/27/2018       Information Obtained From: Patient  Able to Participate in Assessment/Interview: Yes  Patient Presentation: Responsive  Reason for Admission (Per Patient): Active Symptoms  Patient Stressors: Relationship  Coping Skills:   Film/video editorsolation, Music  Leisure Interests (2+):  Music - Listen, Sports - Racing, Sports - [employer]asketball, Sports - Football  Frequency of Recreation/Participation: Pharmacist, communityMonthly  Awareness of Community Resources:  No  Community Resources:     Current Use: No  If no, Barriers?: Other (Comment)(No time)  Expressed Interest in State Street CorporationCommunity Resource Information: No  Enbridge EnergyCounty of Residence:  [place]  Patient Main Form of Transportation: Walk  Patient Strengths:  I help others, I am nice  Patient Identified Areas of Improvement:  My anger  Patient Goal for Hospitalization:  Get help with my anger issues and open up  Current SI (including self-harm):  No  Current HI:  No  Current AVH: No  Staff Intervention Plan: Group Attendance, Collaborate with Interdisciplinary Treatment Team  Consent to Intern Participation: N/A  Lillian Ballester 02/27/2018, 4:30 PM

## 2018-02-27 NOTE — BHH Group Notes (Signed)
LCSW Group Therapy Note 02/27/2018 9:00 AM  Type of Therapy and Topic:  Group Therapy:  Setting Goals  Participation Level:  Did Not Attend  Description of Group: In this process group, patients discussed using strengths to work toward goals and address challenges.  Patients identified two positive things about themselves and one goal they were working on.  Patients were given the opportunity to share openly and support each other's plan for self-empowerment.  The group discussed the value of gratitude and were encouraged to have a daily reflection of positive characteristics or circumstances.  Patients were encouraged to identify a plan to utilize their strengths to work on current challenges and goals.  Therapeutic Goals 1. Patient will verbalize personal strengths/positive qualities and relate how these can assist with achieving desired personal goals 2. Patients will verbalize affirmation of peers plans for personal change and goal setting 3. Patients will explore the value of gratitude and positive focus as related to successful achievement of goals 4. Patients will verbalize a plan for regular reinforcement of personal positive qualities and circumstances.  Summary of Patient Progress:  James Mercado was invited to today's group, but chose not to attend.     Therapeutic Modalities Cognitive Behavioral Therapy Motivational Interviewing    Alease FrameSonya S Sharell Hilmer, [place] 02/27/2018 11:29 AM

## 2018-02-27 NOTE — Progress Notes (Signed)
Recreation Therapy Notes  Date: 02/27/2018  Time: 9:30 am   Location: Craft Room   Behavioral response: N/A   Intervention Topic: Problem Solving  Discussion/Intervention: Patient did not attend group.   Clinical Observations/Feedback:  Patient did not attend group.   Roshaunda Starkey LRT/CTRS        Herlinda Heady 02/27/2018 12:34 PM 

## 2018-02-27 NOTE — Progress Notes (Signed)
[facility] MD Progress Note  02/27/2018 2:18 PM James Mercado  MRN:  [id number] Subjective:   Patient reports that he felt "calm" after taking the Zoloft.  He states that last night he did not sleep well due to another patient making a lot of noise and "banging doors".  So patient reports he woke up this morning feeling a little irritable, but overall patient believes his mood is heading in the right direction.  Patient states that he was able to reach his roommate who notified his mother that he was in the hospital.  He reports his mother wants him to come stay with her for a while.  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  Patient is unsure of the exact date.  But he states he would likely stay in [facility] for about a month.  He states that [facility] is only about 3-3-1/2 hours away so he does not believe it will be a problem for him to follow up with his doctors in [place] for outpatient psychiatric care. She currently denies suicidal ideation, homicidal ideation, auditory or visual hallucinations, paranoia.  Patient also denies any withdrawal symptoms from illicit substances or alcohol.  Principal Problem: Severe major depression, single episode, without psychotic features (HCC) Diagnosis:   Patient Active Problem List   Diagnosis Date Noted  . Suicide attempt (HCC) [T14.91XA] 02/25/2018  . Severe major depression, single episode, without psychotic features (HCC) [F32.2] 02/25/2018  . Alcohol abuse [F10.10] 02/25/2018  . Cocaine abuse [facility]) [F14.10] 02/25/2018   Total Time spent with patient, talking with his treatment team, and reviewing patient's chart: 30 minutes  Past Psychiatric History: Patient denies ever being hospitalized in a psychiatric facility or detox facility.  Patient denies ever being on any psychotropic medications.  Patient reports that he attempted to overdose on Percocet 2-3 days prior to his  strangulation attempt that led to this admission.  Patient also states that he attempted to hang himself from a tree at 32 years old and also attempted overdose on Percocet and OxyContin at 32 years old as well. Prior Inpatient Therapy:  none Prior Outpatient Therapy:  AA meetings    Past Medical History: History reviewed. No pertinent past medical history. History reviewed. No pertinent surgical history. Family History: History reviewed. No pertinent family history. Family Psychiatric  History: pt uncertain of family psychiatric history   Social History:  Social History   Substance and Sexual Activity  Alcohol Use Yes     Social History   Substance and Sexual Activity  Drug Use Yes  . Types: Marijuana    Social History   Socioeconomic History  . Marital status: Single    Spouse name: Not on file  . Number of children: Not on file  . Years of education: Not on file  . Highest education level: Not on file  Occupational History  . Not on file  Social Needs  . Financial resource strain: Not on file  . Food insecurity:    Worry: Not on file    Inability: Not on file  . Transportation needs:    Medical: Not on file    Non-medical: Not on file  Tobacco Use  . Smoking status: Current Every Day Smoker    Packs/day: 1.00  . Smokeless tobacco: Never Used  Substance and Sexual Activity  . Alcohol use: Yes  . Drug use: Yes    Types: Marijuana  . Sexual activity: Not  on file  Lifestyle  . Physical activity:    Days per week: Not on file    Minutes per session: Not on file  . Stress: Not on file  Relationships  . Social connections:    Talks on phone: Not on file    Gets together: Not on file    Attends religious service: Not on file    Active member of club or organization: Not on file    Attends meetings of clubs or organizations: Not on file    Relationship status: Not on file  Other Topics Concern  . Not on file  Social History Narrative  . Not on file    Additional Social History:    Pain Medications: see PTA Prescriptions: see PTA Over the Counter: see PTA History of alcohol / drug use?: Yes Longest period of sobriety (when/how long): Unknown Negative Consequences of Use: Personal relationships     Patient reports that he is originally from [place], but moved to multiple states throughout his life.  Patient reports that he does not have a close relationship with his parents or siblings.  Patient has never been married.  He was in a relationship with the girlfriend of 8 months but she left approximately 1-2 weeks ago.  Patient states he got close to her minor children and misses them.  Patient reports that he graduated from high school.  He works in Holiday representative.  Religion: none.  Hobbies: Auto body work and painting cars.  Patient currently lives with friends.  Patient has a 29 year old daughter that he has not seen since she was 33 years old.    Sleep: poor last night due to noise on unit  Appetite:  Fair  Current Medications: Current Facility-Administered Medications  Medication Dose Route Frequency Provider Last Rate Last Dose  . acetaminophen (TYLENOL) tablet 650 mg  650 mg Oral Q6H PRN Clapacs, John T, MD      . alum & mag hydroxide-simeth (MAALOX/MYLANTA) 200-200-20 MG/5ML suspension 30 mL  30 mL Oral Q4H PRN Clapacs, John T, MD      . folic acid (FOLVITE) tablet 1 mg  1 mg Oral Daily Clapacs, Jackquline Denmark, MD   1 mg at 02/27/18 0844  . hydrOXYzine (ATARAX/VISTARIL) tablet 50 mg  50 mg Oral TID PRN Clapacs, Jackquline Denmark, MD   50 mg at 02/26/18 2054  . LORazepam (ATIVAN) tablet 1 mg  1 mg Oral Q6H PRN Clapacs, Jackquline Denmark, MD       Or  . LORazepam (ATIVAN) injection 1 mg  1 mg Intravenous Q6H PRN Clapacs, John T, MD      . magnesium hydroxide (MILK OF MAGNESIA) suspension 30 mL  30 mL Oral Daily PRN Clapacs, John T, MD      . multivitamin with minerals tablet 1 tablet  1 tablet Oral Daily Clapacs, Jackquline Denmark, MD   1 tablet at 02/27/18 0844  .  nicotine (NICODERM CQ - dosed in mg/24 hours) patch 21 mg  21 mg Transdermal Daily Hessie Knows, MD   21 mg at 02/27/18 0844  . sertraline (ZOLOFT) tablet 50 mg  50 mg Oral Daily Hessie Knows, MD   50 mg at 02/27/18 0844  . thiamine (VITAMIN B-1) tablet 100 mg  100 mg Oral Daily Clapacs, Jackquline Denmark, MD   100 mg at 02/27/18 0844   Or  . thiamine (B-1) injection 100 mg  100 mg Intravenous Daily Clapacs, Jackquline Denmark, MD      . traZODone (DESYREL)  tablet 150 mg  150 mg Oral QHS PRN Hessie Knows'Neal, Raylinn Kosar, MD        Lab Results:  Results for orders placed or performed during the hospital encounter of 02/25/18 (from the past 48 hour(s))  Hemoglobin A1c     Status: None   Collection Time: 02/26/18  6:32 AM  Result Value Ref Range   Hgb A1c MFr Bld 5.6 4.8 - 5.6 %    Comment: (NOTE) Pre diabetes:          5.7%-6.4% Diabetes:              >6.4% Glycemic control for   <7.0% adults with diabetes    Mean Plasma Glucose 114.02 mg/dL    Comment: Performed at [facility], [address] [place], [place] [address]  Lipid panel     Status: None   Collection Time: 02/26/18  6:32 AM  Result Value Ref Range   Cholesterol 150 0 - 200 mg/dL   Triglycerides 604118 <540<150 mg/dL   HDL 65 >98>40 mg/dL   Total CHOL/HDL Ratio 2.3 RATIO   VLDL 24 0 - 40 mg/dL   LDL Cholesterol 61 0 - 99 mg/dL    Comment:        Total Cholesterol/HDL:CHD Risk Coronary Heart Disease Risk Table                     Men   Women  1/2 Average Risk   3.4   3.3  Average Risk       5.0   4.4  2 X Average Risk   9.6   7.1  3 X Average Risk  23.4   11.0        Use the calculated Patient Ratio above and the CHD Risk Table to determine the patient's CHD Risk.        ATP III CLASSIFICATION (LDL):  <100     mg/dL   Optimal  119-147100-129  mg/dL   Near or Above                    Optimal  130-159  mg/dL   Borderline  [id number]  mg/dL   High  >130>190     mg/dL   Very High Performed at [facility], [address] [place], [place] [address]    TSH     Status: None   Collection Time: 02/26/18  6:32 AM  Result Value Ref Range   TSH 3.128 0.350 - 4.500 uIU/mL    Comment: Performed by a 3rd Generation assay with a functional sensitivity of <=0.01 uIU/mL. Performed at [facility], [address] [place], [place] [address]     Blood Alcohol level:  Lab Results  Component Value Date   ETH 161 (H) 02/24/2018    Metabolic Disorder Labs: Lab Results  Component Value Date   HGBA1C 5.6 02/26/2018   MPG 114.02 02/26/2018   No results found for: PROLACTIN Lab Results  Component Value Date   CHOL 150 02/26/2018   TRIG 118 02/26/2018   HDL 65 02/26/2018   CHOLHDL 2.3 02/26/2018   VLDL 24 02/26/2018   LDLCALC 61 02/26/2018    Physical Findings: AIMS: Facial and Oral Movements Muscles of Facial Expression: None, normal Lips and Perioral Area: None, normal Jaw: None, normal Tongue: None, normal,Extremity Movements Upper (arms, wrists, hands, fingers): None, normal Lower (legs, knees, ankles, toes): None, normal, Trunk Movements Neck, shoulders, hips: None, normal, Overall Severity Severity  of abnormal movements (highest score from questions above): None, normal Incapacitation due to abnormal movements: None, normal Patient's awareness of abnormal movements (rate only patient's report): No Awareness, Dental Status Current problems with teeth and/or dentures?: Yes Does patient usually wear dentures?: No  CIWA:  CIWA-Ar Total: 0 COWS:  COWS Total Score: 0  Musculoskeletal: Strength & Muscle Tone: within normal limits Gait & Station: normal Patient leans: N/A  Psychiatric Specialty Exam: Physical Exam  Nursing note and vitals reviewed.   ROS  Blood pressure 116/73, pulse 82, temperature 97.6 F (36.4 C), temperature source Oral, resp. rate 18, height 6\' 2"  (1.88 m), weight 77.6 kg (171 lb), SpO2 100 %.Body mass index is 21.96 kg/m.  General Appearance: Casual and Neat, couple tattoos on upper and lower  extremities and torso.  Eye Contact:  Good  Speech:  Normal Rate  Volume:  Normal  Mood:  "calm"  Affect:  mellow  Thought Process:  Coherent and Linear  Orientation:  Full (Time, Place, and Person)  Thought Content:  Logical  Suicidal Thoughts:  No  Homicidal Thoughts:  No  Memory:  intact  Judgement:  Fair  Insight:  Fair  Psychomotor Activity:  Normal  Concentration:  Concentration: Fair  Recall:  Fair  Fund of Knowledge:  Good  Language:  Good  Akathisia:  No  Handed:  Right  AIMS (if indicated):   N/A  Assets:  Communication Skills Desire for Improvement Financial Resources/Insurance Housing Resilience Transportation  ADL's:  Intact  Cognition:  WNL  Sleep:  Number of Hours: 5.5     Treatment Plan Summary: Daily contact with patient to assess and evaluate symptoms and progress in treatment, Medication management and Plan:  32 year old male admitted for substance abuse issues, depression, and suicide attempt via strangulation.  Patient has never been treated for depression.  He is open to antidepressant.  Patient also admits to drinking alcohol in excess for the past year.  He reports he drinks 12-24 beers per day for the past year.  Patient denies a history of delirium tremens.  Patient also endorses use of cocaine and intermittent use of opiates.  Patient also smokes marijuana.  Patient has never been in inpatient or outpatient treatment for illicit drug use or alcohol abuse, other than going to AA meetings with his friends. She is progressing with treatment.  Major depressive disorder, severe, without psychotic features-  Zoloft 50 mg daily.   Trazodone 150 mg nightly as needed insomnia  Alcohol use disorder, moderate to severe; cocaine use disorder, moderate; cannabis use disorder, moderate; opioid use disorder, mild -CIWA protocol with PRN Ativan -Return for any withdrawal symptoms -Counseling provided; abstinence encouraged; will ask [facility] liaison to talk  with patient    Risk (including but not limited to serotonin syndrome, allergic reaction, anaphylaxis, death, priapism) and benefits discussed with patient he consents to the medications.    Observation Level/Precautions:  15 minute checks  Laboratory:  CBC, reviewed Chemistry Profile, within normal limits TSH 3.128 HbAIC 5.6 UDS + cocaine, THC Blood alcohol level 161 Lipid Panel total cholesterol 150, HDL 65, LDL 61, triglycerides 118 EKG-ordered  Psychotherapy: Group  Medications: See above  Consultations: N/A  Discharge Concerns: We will need follow-up for substance abuse treatment and outpatient psychiatric treatment for depression, medication management  Estimated LOS: 5-7 days      Hessie Knows, MD 02/27/2018, 2:18 PM

## 2018-02-27 NOTE — BHH Group Notes (Signed)
  02/27/2018  Time: 1PM  Type of Therapy/Topic:  Group Therapy:  Balance in Life  Participation Level:  Minimal  Description of Group:   This group will address the concept of balance and how it feels and looks when one is unbalanced. Patients will be encouraged to process areas in their lives that are out of balance and identify reasons for remaining unbalanced. Facilitators will guide patients in utilizing problem-solving interventions to address and correct the stressor making their life unbalanced. Understanding and applying boundaries will be explored and addressed for obtaining and maintaining a balanced life. Patients will be encouraged to explore ways to assertively make their unbalanced needs known to significant others in their lives, using other group members and facilitator for support and feedback.  Therapeutic Goals: 1. Patient will identify two or more emotions or situations they have that consume much of in their lives. 2. Patient will identify signs/triggers that life has become out of balance:  3. Patient will identify two ways to set boundaries in order to achieve balance in their lives:  4. Patient will demonstrate ability to communicate their needs through discussion and/or role plays  Summary of Patient Progress: Pt attended group and participated when prompted by CSW. Pt was observed making comments under his breath to the patient sitting next to him, and was redirected by CSW. Pt was able to accept redirection. Pt would make comments contrasting the purpose of the group, and was attempting to make light of his alcohol use. Pt stated, "I drink all day every day and it works fine for me." CSW asked pt what brought him into the hospital, and asked him to reflect on the actions leading up to his admission. Pt was able to accept this direction as well. Pt reported he would like to spend more time on, "fixing myself," and less time on, "worrying about other people."   Therapeutic  Modalities:   Cognitive Behavioral Therapy Solution-Focused Therapy Assertiveness Training  Heidi DachKelsey Mavi Un, MSW, LCSW Clinical Social Worker 02/27/2018 2:09 PM

## 2018-02-27 NOTE — Plan of Care (Signed)
Patient appropriate with staff & peers.Denies SI,HI and AVH.Compliant with medications.Appetite and energy level good.Patient did not attend all groups since he could not sleep good last night.Support and encouragement given.

## 2018-02-27 NOTE — Progress Notes (Deleted)
Recreation Therapy Notes  INPATIENT RECREATION THERAPY ASSESSMENT  Patient Details Name: Oswaldo Milianhillip Dulany MRN: [id number] DOB: Feb 01, 1986 Today's Date: 02/27/2018       Information Obtained From: Patient  Able to Participate in Assessment/Interview: Yes  Patient Presentation: Responsive  Reason for Admission (Per Patient): Active Symptoms(hearing voices)  Patient Stressors:    Coping Skills:   Talk, Aggression  Leisure Interests (2+):  Social - Friends, Games - Clinical cytogeneticistVideo games, Music - Listen  Frequency of Recreation/Participation: Weekly  Awareness of Community Resources:  Yes  Community Resources:  [employer], [facility]  Current Use: Yes  If no, Barriers?:    Expressed Interest in State Street CorporationCommunity Resource Information:    [employer] EnergyCounty of Residence:  [place]  Patient Main Form of Transportation: Therapist, musicublic Transportation  Patient Strengths:  I do not know  Patient Identified Areas of Improvement:  Improve my anger  Patient Goal for Hospitalization:  Get medication and get disbility so I can find a place to live  Current SI (including self-harm):  No  Current HI:  No  Current AVH: No  Staff Intervention Plan: Group Attendance, Collaborate with Interdisciplinary Treatment Team  Consent to Intern Participation: N/A  Bralon Antkowiak 02/27/2018, 4:23 PM

## 2018-02-27 NOTE — Plan of Care (Addendum)
Patient found in common area with visitors upon my arrival. Patient is visible and social this evening. While discussing future discharge plan, patient states, "She said I might go home on Monday but if I'm not ready, I'm not gonna go." When I probed further, patient reports he is angry and does not know why. Reports anger is increased despite report that he had a "good day" and "good visit" with sister and mother. Patient has had no complaints every night that he has been in my care so this was surprising to me. Affect is dark and flat. Denies SI/HI/AVH. Denies pain. Reports eating and voiding adequately. Patient has no scheduled HS medications. Given increased dose of Trazodone and Vistaril for sleep. Will monitor for efficacy. Compliant with staff direction. Q 15 minute checks maintained. Will continue to monitor throughout the shift.  Patient slept 7.5 hours. No apparent distress. Will endorse care to oncoming shift.  Problem: Physical Regulation: Goal: Complications related to the disease process, condition or treatment will be avoided or minimized Outcome: Progressing   Problem: Education: Goal: Emotional status will improve Outcome: Progressing Goal: Mental status will improve Outcome: Progressing   Problem: Activity: Goal: Interest or engagement in activities will improve Outcome: Progressing

## 2018-02-28 DIAGNOSIS — F322 Major depressive disorder, single episode, severe without psychotic features: Secondary | ICD-10-CM

## 2018-02-28 NOTE — Progress Notes (Signed)
D: Pt denies SI/HI/AVH. Pt is pleasant and cooperative. Pt. has no Complaints.  Patient Interaction appropriate and engages well. Pt. Observed socializing with peers and staff good. Pt. Participation is good. Pt. Attends snacks.   A: Q x 15 minute observation checks were completed for safety. Patient was provided with education.  Patient was given scheduled/prn medications. Patient  was encourage to attend groups, participate in unit activities and continue with plan of care. Pt. Chart and plans of care reviewed. Pt. Given support and encouragement.   R: Patient is complaint with medication and unit procedures.             Precautionary checks every 15 minutes for safety maintained, room free of safety hazards, patient sustains no injury or falls during this shift.

## 2018-02-28 NOTE — Progress Notes (Addendum)
[facility] MD Progress Note  02/28/2018 8:13 AM James Mercado  MRN:  [id number] Subjective:   Patient reports that yesterday afternoon he felt a little irritated but was able to go to his room and calm down.  Patient does not have any specific triggers for his anger.  Patient would like to continue taking Zoloft as he feels that it is calming him down.  Patient denies active suicidal ideation, homicidal ideation, auditory or visual hallucinations.  He is medication compliant.  He reports that he had been visited by his mother.  He is still trying to decide if he will go to his mother's after discharge. Patient is attending groups.  Patient stated he slept well with the increased dose of trazodone.  She reports good appetite.  Principal Problem: Severe major depression, single episode, without psychotic features (HCC) Diagnosis:   Patient Active Problem List   Diagnosis Date Noted  . Suicide attempt (HCC) [T14.91XA] 02/25/2018  . Severe major depression, single episode, without psychotic features (HCC) [F32.2] 02/25/2018  . Alcohol abuse [F10.10] 02/25/2018  . Cocaine abuse [facility]) [F14.10] 02/25/2018   Total Time spent with patient, talking with his treatment team, and reviewing patient's chart: 20 minutes  Past Psychiatric History: Patient denies ever being hospitalized in a psychiatric facility or detox facility.  Patient denies ever being on any psychotropic medications.  Patient reports that he attempted to overdose on Percocet 2-3 days prior to his strangulation attempt that led to this admission.  Patient also states that he attempted to hang himself from a tree at 32 years old and also attempted overdose on Percocet and OxyContin at 32 years old as well. Prior Inpatient Therapy:  none Prior Outpatient Therapy:  AA meetings    Past Medical History: History reviewed. No pertinent past medical history. History reviewed. No pertinent surgical history. Family History: History reviewed. No  pertinent family history. Family Psychiatric  History: pt uncertain of family psychiatric history   Social History:  Social History   Substance and Sexual Activity  Alcohol Use Yes     Social History   Substance and Sexual Activity  Drug Use Yes  . Types: Marijuana    Social History   Socioeconomic History  . Marital status: Single    Spouse name: Not on file  . Number of children: Not on file  . Years of education: Not on file  . Highest education level: Not on file  Occupational History  . Not on file  Social Needs  . Financial resource strain: Not on file  . Food insecurity:    Worry: Not on file    Inability: Not on file  . Transportation needs:    Medical: Not on file    Non-medical: Not on file  Tobacco Use  . Smoking status: Current Every Day Smoker    Packs/day: 1.00  . Smokeless tobacco: Never Used  Substance and Sexual Activity  . Alcohol use: Yes  . Drug use: Yes    Types: Marijuana  . Sexual activity: Not on file  Lifestyle  . Physical activity:    Days per week: Not on file    Minutes per session: Not on file  . Stress: Not on file  Relationships  . Social connections:    Talks on phone: Not on file    Gets together: Not on file    Attends religious service: Not on file    Active member of club or organization: Not on file    Attends meetings of  clubs or organizations: Not on file    Relationship status: Not on file  Other Topics Concern  . Not on file  Social History Narrative  . Not on file   Additional Social History:    Pain Medications: see PTA Prescriptions: see PTA Over the Counter: see PTA History of alcohol / drug use?: Yes Longest period of sobriety (when/how long): Unknown Negative Consequences of Use: Personal relationships     Patient reports that he is originally from [place], but moved to multiple states throughout his life.  Patient reports that he does not have a close relationship with his parents or siblings.   Patient has never been married.  He was in a relationship with the girlfriend of 8 months but she left approximately 1-2 weeks ago.  Patient states he got close to her minor children and misses them.  Patient reports that he graduated from high school.  He works in [employer].  Religion: none.  Hobbies: Auto body work and painting cars.  Patient currently lives with friends.  Patient has a 77 year old daughter that he has not seen since she was 32 years old.    Sleep: poor last night due to noise on unit  Appetite:  Fair  Current Medications: Current Facility-Administered Medications  Medication Dose Route Frequency Provider Last Rate Last Dose  . acetaminophen (TYLENOL) tablet 650 mg  650 mg Oral Q6H PRN Clapacs, John T, MD      . alum & mag hydroxide-simeth (MAALOX/MYLANTA) 200-200-20 MG/5ML suspension 30 mL  30 mL Oral Q4H PRN Clapacs, John T, MD      . folic acid (FOLVITE) tablet 1 mg  1 mg Oral Daily Clapacs, John T, MD   1 mg at 02/28/18 1610  . hydrOXYzine (ATARAX/VISTARIL) tablet 50 mg  50 mg Oral TID PRN Clapacs, Jackquline Denmark, MD   50 mg at 02/27/18 2057  . LORazepam (ATIVAN) tablet 1 mg  1 mg Oral Q6H PRN Clapacs, Jackquline Denmark, MD       Or  . LORazepam (ATIVAN) injection 1 mg  1 mg Intravenous Q6H PRN Clapacs, John T, MD      . magnesium hydroxide (MILK OF MAGNESIA) suspension 30 mL  30 mL Oral Daily PRN Clapacs, John T, MD      . multivitamin with minerals tablet 1 tablet  1 tablet Oral Daily Clapacs, Jackquline Denmark, MD   1 tablet at 02/28/18 0752  . nicotine (NICODERM CQ - dosed in mg/24 hours) patch 21 mg  21 mg Transdermal Daily Hessie Knows, MD   21 mg at 02/28/18 0752  . sertraline (ZOLOFT) tablet 50 mg  50 mg Oral Daily Hessie Knows, MD   50 mg at 02/28/18 0752  . thiamine (VITAMIN B-1) tablet 100 mg  100 mg Oral Daily Clapacs, John T, MD   100 mg at 02/28/18 9604   Or  . thiamine (B-1) injection 100 mg  100 mg Intravenous Daily Clapacs, John T, MD      . traZODone (DESYREL) tablet 150  mg  150 mg Oral QHS PRN Hessie Knows, MD   150 mg at 02/27/18 2057    Lab Results:  No results found for this or any previous visit (from the past 48 hour(s)).  Blood Alcohol level:  Lab Results  Component Value Date   ETH 161 (H) 02/24/2018    Metabolic Disorder Labs: Lab Results  Component Value Date   HGBA1C 5.6 02/26/2018   MPG 114.02 02/26/2018   No results  found for: PROLACTIN Lab Results  Component Value Date   CHOL 150 02/26/2018   TRIG 118 02/26/2018   HDL 65 02/26/2018   CHOLHDL 2.3 02/26/2018   VLDL 24 02/26/2018   LDLCALC 61 02/26/2018    Physical Findings: AIMS: Facial and Oral Movements Muscles of Facial Expression: None, normal Lips and Perioral Area: None, normal Jaw: None, normal Tongue: None, normal,Extremity Movements Upper (arms, wrists, hands, fingers): None, normal Lower (legs, knees, ankles, toes): None, normal, Trunk Movements Neck, shoulders, hips: None, normal, Overall Severity Severity of abnormal movements (highest score from questions above): None, normal Incapacitation due to abnormal movements: None, normal Patient's awareness of abnormal movements (rate only patient's report): No Awareness, Dental Status Current problems with teeth and/or dentures?: Yes Does patient usually wear dentures?: No  CIWA:  CIWA-Ar Total: 0 COWS:  COWS Total Score: 0  Musculoskeletal: Strength & Muscle Tone: within normal limits Gait & Station: normal Patient leans: N/A  Psychiatric Specialty Exam: Physical Exam  Nursing note and vitals reviewed.   ROS  Blood pressure 108/63, pulse (!) 54, temperature 97.9 F (36.6 C), resp. rate 18, height 6\' 2"  (1.88 m), weight 77.6 kg (171 lb), SpO2 98 %.Body mass index is 21.96 kg/m.  General Appearance: Casual and Neat, tattoos on upper and lower extremities and torso.  Eye Contact:  Good  Speech:  Normal Rate  Volume:  Normal  Mood:  "calm"  Affect:  mellow  Thought Process:  Coherent and Linear   Orientation:  Full (Time, Place, and Person)  Thought Content:  Logical  Suicidal Thoughts:  No  Homicidal Thoughts:  No  Memory:  intact  Judgement:  Fair  Insight:  Fair  Psychomotor Activity:  Normal  Concentration:  Concentration: Fair  Recall:  Fair  Fund of Knowledge:  Good  Language:  Good  Akathisia:  No  Handed:  Right  AIMS (if indicated):   N/A  Assets:  Communication Skills Desire for Improvement Financial Resources/Insurance Housing Resilience Transportation  ADL's:  Intact  Cognition:  WNL  Sleep:  Number of Hours: 7.5     Treatment Plan Summary: Daily contact with patient to assess and evaluate symptoms and progress in treatment, Medication management and Plan:  32 year old male admitted for substance abuse issues, depression, and suicide attempt via strangulation.  Patient has never been treated for depression.  He is open to antidepressant.  Patient also admits to drinking alcohol in excess for the past year.  He reports he drinks 12-24 beers per day for the past year.  Patient denies a history of delirium tremens.  Patient also endorses use of cocaine and intermittent use of opiates.  Patient also smokes marijuana.  Patient has never been in inpatient or outpatient treatment for illicit drug use or alcohol abuse, other than going to AA meetings with his friends. Patient is progressing with treatment.  Major depressive disorder, severe, without psychotic features-  Zoloft 50 mg daily.   Trazodone 150 mg nightly as needed insomnia  Alcohol use disorder, moderate to severe; cocaine use disorder, moderate; cannabis use disorder, moderate; opioid use disorder, mild -CIWA protocol with PRN Ativan -Return for any withdrawal symptoms -Counseling provided; abstinence encouraged; will ask [facility] liaison to talk with patient  Nicotine use disorder -nicotine patch  Risk (including but not limited to serotonin syndrome, allergic reaction, anaphylaxis, death,  priapism) and benefits discussed with patient he consents to the medications.    Observation Level/Precautions:  15 minute checks  Laboratory:  CBC, reviewed Chemistry  Profile, within normal limits TSH 3.128 HbAIC 5.6 UDS + cocaine, THC Blood alcohol level 161 Lipid Panel total cholesterol 150, HDL 65, LDL 61, triglycerides 118 EKG-ordered  Psychotherapy: Group  Medications: See above  Consultations: N/A  Discharge Concerns: We will need follow-up for substance abuse treatment and outpatient psychiatric treatment for depression, medication management  Estimated LOS: 5-7 days      Hessie KnowsSarita O'Neal, MD 02/28/2018, 8:13 AM

## 2018-02-28 NOTE — Plan of Care (Signed)
Patient Stated that he could sleep better last night and he is less irritable today.Denies SI,HI and AVH.Appropriate with staff & peers.Compliant with medications.Appetite and energy level good.Attended groups.Support and encouragement given.

## 2018-02-28 NOTE — [facility] Group Notes (Signed)
[facility] Group Notes:  (Nursing/MHT/Case Management/Adjunct)  Date:  02/28/2018  Time:  2:52 PM  Type of Therapy:  Psychoeducational Skills  Participation Level:  Active  Participation Quality:  Appropriate, Attentive and Sharing  Affect:  Appropriate  Cognitive:  Alert and Appropriate  Insight:  Appropriate  Engagement in Group:  Engaged  Modes of Intervention:  Activity and Education  Summary of Progress/Problems:  Lynelle SmokeCara Travis Comfort Iversen 02/28/2018, 2:52 PM

## 2018-02-28 NOTE — Progress Notes (Signed)
Recreation Therapy Notes   Date: 02/28/2018  Time: 9:30 am  Location: Craft Room  Behavioral response: Appropriate    Intervention Topic: Leisure  Discussion/Intervention:   Group content today was focused on leisure. The group defined what leisure is and some positive leisure activities they participate in. Individuals identified the difference between good and bad leisure. Participants expressed how they feel after participating in the leisure of their choice. The group discussed how they go about picking a leisure activity and if others are involved in their leisure activities. The patient stated how many leisure activities they too choose from and reasons why it is important to have leisure time. Individuals participated in the intervention "Exploration of Leisure" where they had a chance to identify new leisure activities as well as benefits of leisure.  Clinical Observations/Feedback:  Patient came to group and stated it is important to do what participate in leisure to do the things you like. Individual was social with peers and staff while participating in the intervention.  Jeremie Giangrande LRT/CTRS         Melanie Openshaw 02/28/2018 12:45 PM

## 2018-02-28 NOTE — Plan of Care (Signed)
Pt. Has good insight into why he is here and is able to identify some strategies to improve his condition. Pt. Verbalizes understanding of provided education. Pt. Participates in groups and unit activities well. Socializes with peers good. Pt. Compliant with medications. Pt. Denies SI/HI. Pt. Verbally is able to contract for safety.    Problem: Health Behavior/Discharge Planning: Goal: Ability to identify changes in lifestyle to reduce recurrence of condition will improve Outcome: Progressing   Problem: Education: Goal: Knowledge of [place] [facility] Education information/materials will improve Outcome: Progressing   Problem: Activity: Goal: Interest or engagement in activities will improve Outcome: Progressing   Problem: Health Behavior/Discharge Planning: Goal: Compliance with treatment plan for underlying cause of condition will improve Outcome: Progressing   Problem: Safety: Goal: Periods of time without injury will increase Outcome: Progressing   Problem: Education: Goal: Knowledge of General Education information will improve Outcome: Progressing

## 2018-02-28 NOTE — [facility] Group Notes (Signed)
02/28/2018 1PM  Type of Therapy and Topic:  Group Therapy:  Feelings around Relapse and Recovery  Participation Level:  Active   Description of Group:    Patients in this group will discuss emotions they experience before and after a relapse. They will process how experiencing these feelings, or avoidance of experiencing them, relates to having a relapse. Facilitator will guide patients to explore emotions they have related to recovery. Patients will be encouraged to process which emotions are more powerful. They will be guided to discuss the emotional reaction significant others in their lives may have to patients' relapse or recovery. Patients will be assisted in exploring ways to respond to the emotions of others without this contributing to a relapse.  Therapeutic Goals: 1. Patient will identify two or more emotions that lead to a relapse for them 2. Patient will identify two emotions that result when they relapse 3. Patient will identify two emotions related to recovery 4. Patient will demonstrate ability to communicate their needs through discussion and/or role plays   Summary of Patient Progress: Actively and appropriately engaged in the group. Patient was able to provide support and validation to other group members.Patient practiced active listening when interacting with the facilitator and other group members. Pt. 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 Patient is still in the process of obtaining treatment goals.      Therapeutic Modalities:   Cognitive Behavioral Therapy Solution-Focused Therapy Assertiveness Training Relapse Prevention Therapy   Johny ShearsCassandra  Briany Aye, LCSW 02/28/2018 2:26 PM

## 2018-03-01 DIAGNOSIS — F322 Major depressive disorder, single episode, severe without psychotic features: Principal | ICD-10-CM

## 2018-03-01 MED ORDER — LORATADINE 10 MG PO TABS
10.0000 mg | ORAL_TABLET | Freq: Every day | ORAL | Status: DC
  Administered 2018-03-01 – 2018-03-03 (×3): 10 mg via ORAL
  Filled 2018-03-01 (×3): qty 1

## 2018-03-01 NOTE — Progress Notes (Signed)
D: Patient stated slept good last night .Stated appetite is good and energy level  Is normal. Stated concentration is good . Stated on Depression scale 0 , hopeless 0 and anxiety 0 .( low 0-10 high) Denies suicidal  homicidal ideations  .  No auditory hallucinations  No pain concerns . Appropriate ADL'S. Interacting with peers and staff. Patient able to identify how his lifestyle has affected  his lively hood    and what changes need to be made .  Able  to look at resources for long term . Understand diease process . Information given in concrete form  for better understanding. Emotional and mental status  improved   A: Encourage patient participation with unit programming . Instruction  Given on  Medication , verbalize understanding.  R: Voice no other concerns. Staff continue to monitor

## 2018-03-01 NOTE — Plan of Care (Signed)
Pt. Verbalizes understanding of provided education. Pt. Reports doing, "good" this evening. Pt. Participates in groups and unit activities appropriately. Pt. Compliant with medications. Pt. Socializes with peers appropriately. Pt. Sleeping and eating good. Pt. Denies SI/HI. Pt. Monitored for safety by staff.   Problem: Education: Goal: Knowledge of [place] [facility] Education information/materials will improve Outcome: Progressing Goal: Emotional status will improve Outcome: Progressing Goal: Mental status will improve Outcome: Progressing   Problem: Activity: Goal: Interest or engagement in activities will improve Outcome: Progressing Goal: Sleeping patterns will improve Outcome: Progressing   Problem: Health Behavior/Discharge Planning: Goal: Compliance with treatment plan for underlying cause of condition will improve Outcome: Progressing   Problem: Safety: Goal: Periods of time without injury will increase Outcome: Progressing

## 2018-03-01 NOTE — [facility] Group Notes (Signed)
LCSW Group Therapy Note  03/01/2018 1:15pm  Type of Therapy and Topic: Group Therapy: Holding on to Grudges   Participation Level: Active   Description of Group:  In this group patients will be asked to explore and define a grudge. Patients will be guided to discuss their thoughts, feelings, and reasons as to why people have grudges. Patients will process the impact grudges have on daily life and identify thoughts and feelings related to holding grudges. Facilitator will challenge patients to identify ways to let go of grudges and the benefits this provides. Patients will be confronted to address why one struggles letting go of grudges. Lastly, patients will identify feelings and thoughts related to what life would look like without grudges. This group will be process-oriented, with patients participating in exploration of their own experiences, giving and receiving support, and processing challenge from other group members.  Therapeutic Goals:  1. Patient will identify specific grudges related to their personal life.  2. Patient will identify feelings, thoughts, and beliefs around grudges.  3. Patient will identify how one releases grudges appropriately.  4. Patient will identify situations where they could have let go of the grudge, but instead chose to hold on.   Summary of Patient Progress: The patient scored his mood at a 10 (10 best). Patients were guided to discuss their thoughts, feelings, and reasons as to why people have grudges. The patient was able to process the impact grudges have on daily life and identified thoughts and feelings related to holding grudges. The patient was challenged to identify ways to let go of grudges and the benefits this provides. Pt. actively and appropriately engaged in the group. Patient was able to provide support and validation to other group members.    Therapeutic Modalities:  Cognitive Behavioral Therapy  Solution Focused Therapy  Motivational  Interviewing  Brief Therapy   Dare Spillman  CUEBAS-COLON, LCSW 03/01/2018 10:13 AM

## 2018-03-01 NOTE — Plan of Care (Signed)
Patient able to identify how his lifestyle has affected  his lively hood    and what changes need to be made .  Able  to look at resources for long term . Understand diease process . Information given in concrete form  for better understanding. Emotional and mental status  improved   Problem: Health Behavior/Discharge Planning: Goal: Ability to identify changes in lifestyle to reduce recurrence of condition will improve Outcome: Progressing Goal: Identification of resources available to assist in meeting health care needs will improve Outcome: Progressing   Problem: Physical Regulation: Goal: Complications related to the disease process, condition or treatment will be avoided or minimized Outcome: Progressing   Problem: Education: Goal: Knowledge of [place] [facility] Education information/materials will improve Outcome: Progressing Goal: Emotional status will improve Outcome: Progressing Goal: Mental status will improve Outcome: Progressing Goal: Verbalization of understanding the information provided will improve Outcome: Progressing   Problem: Activity: Goal: Interest or engagement in activities will improve Outcome: Progressing Goal: Sleeping patterns will improve Outcome: Progressing   Problem: Health Behavior/Discharge Planning: Goal: Identification of resources available to assist in meeting health care needs will improve Outcome: Progressing Goal: Compliance with treatment plan for underlying cause of condition will improve Outcome: Progressing   Problem: Safety: Goal: Periods of time without injury will increase Outcome: Progressing   Problem: Education: Goal: Knowledge of General Education information will improve Outcome: Progressing

## 2018-03-01 NOTE — Progress Notes (Signed)
D: Pt denies SI/HI/AVH. Pt is pleasant and cooperative. Pt. has no Complaints.  Patient Interaction appropriate with staff and peers. Pt. Has good insight and motivated for growth and positive change.   A: Q x 15 minute observation checks were completed for safety. Patient was provided with education.  Patient was given medications per orders. Patient  was encourage to attend groups, participate in unit activities and continue with plan of care. Pt. Chart and plans of care reviewed. Pt. Given support and encouragement.   R: Patient is complaint with medication and unit procedures.             Precautionary checks every 15 minutes for safety maintained, room free of safety hazards, patient sustains no injury or falls during this shift. Will endorse care to oncoming shift.

## 2018-03-01 NOTE — Progress Notes (Signed)
[facility] MD Progress Note  03/01/2018 1:50 PM James Mercado  MRN:  [id number] Subjective: Follow-up note for this young man with depression and alcohol and cocaine abuse.  Patient says his mood is slightly better although he still feels a little depressed.  Denies suicidal ideation.  Denies psychotic symptoms.  No physical complaints.  Starting to interact more appropriately on the unit.  No sign of acute withdrawal or confusion. Principal Problem: Severe major depression, single episode, without psychotic features (HCC) Diagnosis:   Patient Active Problem List   Diagnosis Date Noted  . Suicide attempt (HCC) [T14.91XA] 02/25/2018  . Severe major depression, single episode, without psychotic features (HCC) [F32.2] 02/25/2018  . Alcohol abuse [F10.10] 02/25/2018  . Cocaine abuse (HCC) [F14.10] 02/25/2018   Total Time spent with patient: 20 minutes  Past Psychiatric History: Past history of substance abuse mood disorder suicide attempts  Past Medical History: History reviewed. No pertinent past medical history. History reviewed. No pertinent surgical history. Family History: History reviewed. No pertinent family history. Family Psychiatric  History: See previous notes Social History:  Social History   Substance and Sexual Activity  Alcohol Use Yes     Social History   Substance and Sexual Activity  Drug Use Yes  . Types: Marijuana    Social History   Socioeconomic History  . Marital status: Single    Spouse name: Not on file  . Number of children: Not on file  . Years of education: Not on file  . Highest education level: Not on file  Occupational History  . Not on file  Social Needs  . Financial resource strain: Not on file  . Food insecurity:    Worry: Not on file    Inability: Not on file  . Transportation needs:    Medical: Not on file    Non-medical: Not on file  Tobacco Use  . Smoking status: Current Every Day Smoker    Packs/day: 1.00  . Smokeless tobacco: Never  Used  Substance and Sexual Activity  . Alcohol use: Yes  . Drug use: Yes    Types: Marijuana  . Sexual activity: Not on file  Lifestyle  . Physical activity:    Days per week: Not on file    Minutes per session: Not on file  . Stress: Not on file  Relationships  . Social connections:    Talks on phone: Not on file    Gets together: Not on file    Attends religious service: Not on file    Active member of club or organization: Not on file    Attends meetings of clubs or organizations: Not on file    Relationship status: Not on file  Other Topics Concern  . Not on file  Social History Narrative  . Not on file   Additional Social History:    Pain Medications: see PTA Prescriptions: see PTA Over the Counter: see PTA History of alcohol / drug use?: Yes Longest period of sobriety (when/how long): Unknown Negative Consequences of Use: Personal relationships                    Sleep: Good  Appetite:  Good  Current Medications: Current Facility-Administered Medications  Medication Dose Route Frequency Provider Last Rate Last Dose  . acetaminophen (TYLENOL) tablet 650 mg  650 mg Oral Q6H PRN Rim Thatch T, MD      . alum & mag hydroxide-simeth (MAALOX/MYLANTA) 200-200-20 MG/5ML suspension 30 mL  30 mL Oral Q4H PRN  Beckett Hickmon, Jackquline DenmarkJohn T, MD      . folic acid (FOLVITE) tablet 1 mg  1 mg Oral Daily Promise Bushong T, MD   1 mg at 03/01/18 [id number]  . hydrOXYzine (ATARAX/VISTARIL) tablet 50 mg  50 mg Oral TID PRN Jaid Quirion, Jackquline DenmarkJohn T, MD   50 mg at 02/27/18 2057  . magnesium hydroxide (MILK OF MAGNESIA) suspension 30 mL  30 mL Oral Daily PRN Lucero Ide T, MD      . multivitamin with minerals tablet 1 tablet  1 tablet Oral Daily Hollyanne Schloesser T, MD   1 tablet at 03/01/18 0826  . nicotine (NICODERM CQ - dosed in mg/24 hours) patch 21 mg  21 mg Transdermal Daily Hessie Knows'Neal, Sarita, MD   21 mg at 03/01/18 0825  . sertraline (ZOLOFT) tablet 50 mg  50 mg Oral Daily Hessie Knows'Neal, Sarita, MD   50 mg at  03/01/18 0826  . thiamine (VITAMIN B-1) tablet 100 mg  100 mg Oral Daily Fany Cavanaugh T, MD   100 mg at 03/01/18 [id number]   Or  . thiamine (B-1) injection 100 mg  100 mg Intravenous Daily Jade Burright T, MD      . traZODone (DESYREL) tablet 150 mg  150 mg Oral QHS PRN Hessie Knows'Neal, Sarita, MD   150 mg at 02/28/18 2209    Lab Results: No results found for this or any previous visit (from the past 48 hour(s)).  Blood Alcohol level:  Lab Results  Component Value Date   ETH 161 (H) 02/24/2018    Metabolic Disorder Labs: Lab Results  Component Value Date   HGBA1C 5.6 02/26/2018   MPG 114.02 02/26/2018   No results found for: PROLACTIN Lab Results  Component Value Date   CHOL 150 02/26/2018   TRIG 118 02/26/2018   HDL 65 02/26/2018   CHOLHDL 2.3 02/26/2018   VLDL 24 02/26/2018   LDLCALC 61 02/26/2018    Physical Findings: AIMS: Facial and Oral Movements Muscles of Facial Expression: None, normal Lips and Perioral Area: None, normal Jaw: None, normal Tongue: None, normal,Extremity Movements Upper (arms, wrists, hands, fingers): None, normal Lower (legs, knees, ankles, toes): None, normal, Trunk Movements Neck, shoulders, hips: None, normal, Overall Severity Severity of abnormal movements (highest score from questions above): None, normal Incapacitation due to abnormal movements: None, normal Patient's awareness of abnormal movements (rate only patient's report): No Awareness, Dental Status Current problems with teeth and/or dentures?: Yes Does patient usually wear dentures?: No  CIWA:  CIWA-Ar Total: 0 COWS:  COWS Total Score: 0  Musculoskeletal: Strength & Muscle Tone: within normal limits Gait & Station: normal Patient leans: N/A  Psychiatric Specialty Exam: Physical Exam  Nursing note and vitals reviewed. Constitutional: He appears well-developed and well-nourished.  HENT:  Head: Normocephalic and atraumatic.  Eyes: Pupils are equal, round, and reactive to light.  Conjunctivae are normal.  Neck: Normal range of motion.  Cardiovascular: Regular rhythm and normal heart sounds.  Respiratory: Effort normal.  GI: Soft.  Musculoskeletal: Normal range of motion.  Neurological: He is alert.  Skin: Skin is warm and dry.  Psychiatric: Judgment normal. His affect is blunt. His speech is delayed. He is slowed. Cognition and memory are normal. He expresses no homicidal and no suicidal ideation.    Review of Systems  Constitutional: Negative.   HENT: Negative.   Eyes: Negative.   Respiratory: Negative.   Cardiovascular: Negative.   Gastrointestinal: Negative.   Musculoskeletal: Negative.   Skin: Negative.   Neurological: Negative.   Psychiatric/Behavioral:  Positive for depression. Negative for hallucinations, memory loss, substance abuse and suicidal ideas. The patient is not nervous/anxious and does not have insomnia.     Blood pressure 127/80, pulse 62, temperature (!) 97.4 F (36.3 C), temperature source Oral, resp. rate 18, height 6\' 2"  (1.88 m), weight 77.6 kg (171 lb), SpO2 100 %.Body mass index is 21.96 kg/m.  General Appearance: Casual  Eye Contact:  Fair  Speech:  Clear and Coherent  Volume:  Normal  Mood:  Dysphoric  Affect:  Congruent  Thought Process:  Goal Directed  Orientation:  Full (Time, Place, and Person)  Thought Content:  Logical  Suicidal Thoughts:  No  Homicidal Thoughts:  No  Memory:  Immediate;   Fair Recent;   Fair Remote;   Fair  Judgement:  Fair  Insight:  Fair  Psychomotor Activity:  Decreased  Concentration:  Concentration: Fair  Recall:  [employer] of Knowledge:  Fair  Language:  Fair  Akathisia:  No  Handed:  Right  AIMS (if indicated):     Assets:  Desire for Improvement Physical Health Resilience  ADL's:  Intact  Cognition:  WNL  Sleep:  Number of Hours: 5.45     Treatment Plan Summary: Daily contact with patient to assess and evaluate symptoms and progress in treatment, Medication management and  Plan Continue substance abuse treatment and medication management.  No change to treatment for today.  Reviewed plan with patient and encouraged him to get up out of bed and be interactive on the unit.  Reviewed situation with nursing.  No other new change to plan.  Mordecai Rasmussen, MD 03/01/2018, 1:50 PM

## 2018-03-02 NOTE — Progress Notes (Signed)
D: Patient stated slept good last night .Stated appetite is good and energy level  Is normal. Stated concentration is good . Stated on Depression scale 0 , hopeless 0 and anxiety 0 .( low 0-10 high) Denies suicidal  homicidal ideations  .  No auditory hallucinations  No pain concerns . Appropriate ADL'S. Interacting with peers and staff. Patient able to identify how his lifestyle has affected  his lively hood    and what changes need to be made . Looking at possible  Discharge this week A: Encourage patient participation with unit programming . Instruction  Given on  Medication , verbalize understanding. R: Voice no other concerns. Staff continue to monitor

## 2018-03-02 NOTE — Plan of Care (Addendum)
Patient found in common area upon my arrival. Patient is visible and social this evening. Patient complains of some anxiety, reports difficulty falling asleep. Given Vistaril and Trazodone for sleep. Will monitor for efficacy. Denies SI/HI/AVH. Denies pain. Reports eating and voiding adequately. Compliant with HS medications and staff direction. Attends group. Q 15 minute checks maintained. Will continue to monitor throughout the shift. Patient slept 7 hours. No apparent distress. Will endorse care to oncoming shift.  Problem: Education: Goal: Knowledge of [place] [facility] Education information/materials will improve Outcome: Progressing Goal: Emotional status will improve Outcome: Progressing Goal: Mental status will improve Outcome: Progressing Goal: Verbalization of understanding the information provided will improve Outcome: Progressing   Problem: Activity: Goal: Interest or engagement in activities will improve Outcome: Progressing

## 2018-03-02 NOTE — Plan of Care (Signed)
Able  to look at resources for long term . Understand diease process . Information given in concrete form  for better understanding. Emotional and mental status  improved Patient able to identify how his lifestyle has affected  his lively hood    and what changes need to be made .    Problem: Health Behavior/Discharge Planning: Goal: Ability to identify changes in lifestyle to reduce recurrence of condition will improve Outcome: Progressing Goal: Identification of resources available to assist in meeting health care needs will improve Outcome: Progressing   Problem: Physical Regulation: Goal: Complications related to the disease process, condition or treatment will be avoided or minimized Outcome: Progressing   Problem: Education: Goal: Knowledge of [place] [facility] Education information/materials will improve Outcome: Progressing Goal: Emotional status will improve Outcome: Progressing Goal: Mental status will improve Outcome: Progressing Goal: Verbalization of understanding the information provided will improve Outcome: Progressing   Problem: Activity: Goal: Interest or engagement in activities will improve Outcome: Progressing Goal: Sleeping patterns will improve Outcome: Progressing   Problem: Health Behavior/Discharge Planning: Goal: Identification of resources available to assist in meeting health care needs will improve Outcome: Progressing Goal: Compliance with treatment plan for underlying cause of condition will improve Outcome: Progressing   Problem: Safety: Goal: Periods of time without injury will increase Outcome: Progressing   Problem: Education: Goal: Knowledge of General Education information will improve Outcome: Progressing

## 2018-03-02 NOTE — [facility] Group Notes (Signed)
LCSW Group Therapy Note 03/02/2018 1:15pm  Type of Therapy and Topic: Group Therapy: Feelings Around Returning Home & Establishing a Supportive Framework and Supporting Oneself When Supports Not Available  Participation Level: Did Not Attend  Description of Group:  Patients first processed thoughts and feelings about upcoming discharge. These included fears of upcoming changes, lack of change, new living environments, judgements and expectations from others and overall stigma of mental health issues. The group then discussed the definition of a supportive framework, what that looks and feels like, and how do to discern it from an unhealthy non-supportive network. The group identified different types of supports as well as what to do when your family/friends are less than helpful or unavailable  Therapeutic Goals  1. Patient will identify one healthy supportive network that they can use at discharge. 2. Patient will identify one factor of a supportive framework and how to tell it from an unhealthy network. 3. Patient able to identify one coping skill to use when they do not have positive supports from others. 4. Patient will demonstrate ability to communicate their needs through discussion and/or role plays.  Summary of Patient Progress:  Pt was invited to attend group but chose not to attend. CSW will continue to encourage pt to attend group throughout their admission.   Therapeutic Modalities Cognitive Behavioral Therapy Motivational Interviewing   James Mercado  CUEBAS-COLON, LCSW 03/02/2018 10:04 AM

## 2018-03-02 NOTE — Progress Notes (Signed)
[facility] MD Progress Note  03/02/2018 12:04 PM James Mercado  MRN:  [id number] Subjective: Follow-up for this patient with depression and substance abuse.  Patient denies suicidal thoughts.  Mood is feeling a little bit better.  Still has restless dreams at night but is feeling more calm during the day.  Capable of interacting with others appropriately.  Patient is not showing active signs or alcohol and drug abuse or withdrawal and is engaged in treatment Principal Problem: Severe major depression, single episode, without psychotic features (HCC) Diagnosis:   Patient Active Problem List   Diagnosis Date Noted  . Suicide attempt (HCC) [T14.91XA] 02/25/2018  . Severe major depression, single episode, without psychotic features (HCC) [F32.2] 02/25/2018  . Alcohol abuse [F10.10] 02/25/2018  . Cocaine abuse (HCC) [F14.10] 02/25/2018   Total Time spent with patient: 20 minutes  Past Psychiatric History: History of substance abuse with some withdrawal issues history of depression with suicidality  Past Medical History: History reviewed. No pertinent past medical history. History reviewed. No pertinent surgical history. Family History: History reviewed. No pertinent family history. Family Psychiatric  History: See previous note Social History:  Social History   Substance and Sexual Activity  Alcohol Use Yes     Social History   Substance and Sexual Activity  Drug Use Yes  . Types: Marijuana    Social History   Socioeconomic History  . Marital status: Single    Spouse name: Not on file  . Number of children: Not on file  . Years of education: Not on file  . Highest education level: Not on file  Occupational History  . Not on file  Social Needs  . Financial resource strain: Not on file  . Food insecurity:    Worry: Not on file    Inability: Not on file  . Transportation needs:    Medical: Not on file    Non-medical: Not on file  Tobacco Use  . Smoking status: Current Every  Day Smoker    Packs/day: 1.00  . Smokeless tobacco: Never Used  Substance and Sexual Activity  . Alcohol use: Yes  . Drug use: Yes    Types: Marijuana  . Sexual activity: Not on file  Lifestyle  . Physical activity:    Days per week: Not on file    Minutes per session: Not on file  . Stress: Not on file  Relationships  . Social connections:    Talks on phone: Not on file    Gets together: Not on file    Attends religious service: Not on file    Active member of club or organization: Not on file    Attends meetings of clubs or organizations: Not on file    Relationship status: Not on file  Other Topics Concern  . Not on file  Social History Narrative  . Not on file   Additional Social History:    Pain Medications: see PTA Prescriptions: see PTA Over the Counter: see PTA History of alcohol / drug use?: Yes Longest period of sobriety (when/how long): Unknown Negative Consequences of Use: Personal relationships                    Sleep: Fair  Appetite:  Fair  Current Medications: Current Facility-Administered Medications  Medication Dose Route Frequency Provider Last Rate Last Dose  . acetaminophen (TYLENOL) tablet 650 mg  650 mg Oral Q6H PRN Jamarkus Lisbon, Jackquline DenmarkJohn T, MD      . alum & mag hydroxide-simeth (MAALOX/MYLANTA)  200-200-20 MG/5ML suspension 30 mL  30 mL Oral Q4H PRN Jaki Hammerschmidt T, MD      . folic acid (FOLVITE) tablet 1 mg  1 mg Oral Daily Lanaysia Fritchman, Jackquline Denmark, MD   1 mg at 03/02/18 0844  . hydrOXYzine (ATARAX/VISTARIL) tablet 50 mg  50 mg Oral TID PRN Ebony Yorio, Jackquline Denmark, MD   50 mg at 02/27/18 2057  . loratadine (CLARITIN) tablet 10 mg  10 mg Oral Daily Dailon Sheeran, Jackquline Denmark, MD   10 mg at 03/02/18 0844  . magnesium hydroxide (MILK OF MAGNESIA) suspension 30 mL  30 mL Oral Daily PRN Mekaila Tarnow T, MD      . multivitamin with minerals tablet 1 tablet  1 tablet Oral Daily Alara Daniel, Jackquline Denmark, MD   1 tablet at 03/02/18 0844  . nicotine (NICODERM CQ - dosed in mg/24 hours) patch  21 mg  21 mg Transdermal Daily Hessie Knows, MD   21 mg at 03/02/18 0847  . sertraline (ZOLOFT) tablet 50 mg  50 mg Oral Daily Hessie Knows, MD   50 mg at 03/02/18 0844  . thiamine (VITAMIN B-1) tablet 100 mg  100 mg Oral Daily Kenwood Rosiak T, MD   100 mg at 03/02/18 0844   Or  . thiamine (B-1) injection 100 mg  100 mg Intravenous Daily Mcguire Gasparyan T, MD      . traZODone (DESYREL) tablet 150 mg  150 mg Oral QHS PRN Hessie Knows, MD   150 mg at 03/01/18 2129    Lab Results: No results found for this or any previous visit (from the past 48 hour(s)).  Blood Alcohol level:  Lab Results  Component Value Date   ETH 161 (H) 02/24/2018    Metabolic Disorder Labs: Lab Results  Component Value Date   HGBA1C 5.6 02/26/2018   MPG 114.02 02/26/2018   No results found for: PROLACTIN Lab Results  Component Value Date   CHOL 150 02/26/2018   TRIG 118 02/26/2018   HDL 65 02/26/2018   CHOLHDL 2.3 02/26/2018   VLDL 24 02/26/2018   LDLCALC 61 02/26/2018    Physical Findings: AIMS: Facial and Oral Movements Muscles of Facial Expression: None, normal Lips and Perioral Area: None, normal Jaw: None, normal Tongue: None, normal,Extremity Movements Upper (arms, wrists, hands, fingers): None, normal Lower (legs, knees, ankles, toes): None, normal, Trunk Movements Neck, shoulders, hips: None, normal, Overall Severity Severity of abnormal movements (highest score from questions above): None, normal Incapacitation due to abnormal movements: None, normal Patient's awareness of abnormal movements (rate only patient's report): No Awareness, Dental Status Current problems with teeth and/or dentures?: Yes Does patient usually wear dentures?: No  CIWA:  CIWA-Ar Total: 0 COWS:  COWS Total Score: 0  Musculoskeletal: Strength & Muscle Tone: within normal limits Gait & Station: normal Patient leans: N/A  Psychiatric Specialty Exam: Physical Exam  Nursing note and vitals  reviewed. Constitutional: He appears well-developed and well-nourished.  HENT:  Head: Normocephalic and atraumatic.  Eyes: Pupils are equal, round, and reactive to light. Conjunctivae are normal.  Neck: Normal range of motion.  Cardiovascular: Regular rhythm and normal heart sounds.  Respiratory: Effort normal. No respiratory distress.  GI: Soft.  Musculoskeletal: Normal range of motion.  Neurological: He is alert.  Skin: Skin is warm and dry.  Psychiatric: Judgment normal. His affect is blunt. His speech is delayed. His speech is not rapid and/or pressured. He is slowed. Thought content is not paranoid. He expresses no homicidal and no suicidal ideation.  He exhibits abnormal recent memory.    Review of Systems  Constitutional: Negative.   HENT: Negative.   Eyes: Negative.   Respiratory: Negative.   Cardiovascular: Negative.   Gastrointestinal: Negative.   Musculoskeletal: Negative.   Skin: Negative.   Neurological: Negative.   Psychiatric/Behavioral: Positive for depression. Negative for hallucinations, memory loss, substance abuse and suicidal ideas. The patient is not nervous/anxious and does not have insomnia.     Blood pressure (!) 113/59, pulse 85, temperature 97.6 F (36.4 C), temperature source Oral, resp. rate 18, height 6\' 2"  (1.88 m), weight 77.6 kg (171 lb), SpO2 99 %.Body mass index is 21.96 kg/m.  General Appearance: Casual  Eye Contact:  Good  Speech:  Slow  Volume:  Decreased  Mood:  Dysphoric  Affect:  Congruent  Thought Process:  Goal Directed  Orientation:  Full (Time, Place, and Person)  Thought Content:  Logical  Suicidal Thoughts:  No  Homicidal Thoughts:  No  Memory:  Immediate;   Fair Recent;   Fair Remote;   Fair  Judgement:  Fair  Insight:  Fair  Psychomotor Activity:  Decreased  Concentration:  Concentration: Fair  Recall:  [employer] of Knowledge:  Fair  Language:  Fair  Akathisia:  No  Handed:  Right  AIMS (if indicated):     Assets:   Desire for Improvement Physical Health Resilience Social Support  ADL's:  Intact  Cognition:  WNL  Sleep:  Number of Hours: 4.45     Treatment Plan Summary: Daily contact with patient to assess and evaluate symptoms and progress in treatment, Medication management and Plan Mood improving.  Stabilizing although still somewhat dysphoric.  No suicidality.  No psychosis.  Still does not sleep very well at night but is not specifically complaining.  Participates in group and individual therapy appropriately.  No change to medication or plan.  Mordecai Rasmussen, MD 03/02/2018, 12:04 PM

## 2018-03-02 NOTE — [facility] Group Notes (Signed)
[facility] Group Notes:  (Nursing/MHT/Case Management/Adjunct)  Date:  03/02/2018  Time:  10:01 PM  Type of Therapy:  Group Therapy  Participation Level:  Active  Participation Quality:  Appropriate  Affect:  Appropriate  Cognitive:  Appropriate  Insight:  Appropriate  Engagement in Group:  Engaged  Modes of Intervention:  Support  Summary of Progress/Problems:  Diondre Pulis 03/02/2018, 10:01 PM

## 2018-03-03 DIAGNOSIS — F322 Major depressive disorder, single episode, severe without psychotic features: Secondary | ICD-10-CM

## 2018-03-03 MED ORDER — TRAZODONE HCL 150 MG PO TABS
150.0000 mg | ORAL_TABLET | Freq: Every evening | ORAL | 0 refills | Status: DC | PRN
Start: 2018-03-03 — End: 2018-03-03

## 2018-03-03 MED ORDER — HYDROXYZINE HCL 50 MG PO TABS: 50.0000 mg | ORAL_TABLET | Freq: Every evening | ORAL | 0 refills | Status: AC | PRN

## 2018-03-03 MED ORDER — HYDROXYZINE HCL 50 MG PO TABS: 50.0000 mg | ORAL_TABLET | Freq: Every evening | ORAL | 0 refills | Status: DC | PRN

## 2018-03-03 MED ORDER — TRAZODONE HCL 150 MG PO TABS: 150.0000 mg | ORAL_TABLET | Freq: Every evening | ORAL | 0 refills | Status: DC | PRN

## 2018-03-03 MED ORDER — TRAZODONE HCL 150 MG PO TABS: 150.0000 mg | ORAL_TABLET | Freq: Every evening | ORAL | 0 refills | Status: AC | PRN

## 2018-03-03 MED ORDER — SERTRALINE HCL 50 MG PO TABS: 50.0000 mg | ORAL_TABLET | Freq: Every day | ORAL | 0 refills | Status: DC

## 2018-03-03 MED ORDER — SERTRALINE HCL 50 MG PO TABS: 50.0000 mg | ORAL_TABLET | Freq: Every day | ORAL | 0 refills | Status: AC

## 2018-03-03 NOTE — Progress Notes (Signed)
D: Understand diease process . Information given in concrete form  for better understanding. Emotional and mental status  improved Patient able to identify how his lifestyle has affected  his lively hood    and what changes need to be made . D: Patient stated slept good last night .Stated appetite is good and energy level  Is normal. Stated concentration is good .  Denies suicidal  homicidal ideations  .  Marland Kitchen. Appropriate ADL'S. Interacting with peers and staff.  A: Encourage patient participation with unit programming . Instruction  Given on  Medication , verbalize understanding. R: Voice no other concerns. Staff continue to monitor

## 2018-03-03 NOTE — Plan of Care (Signed)
Understand diease process . Information given in concrete form  for better understanding. Emotional and mental status  improved Patient able to identify how his lifestyle has affected  his lively hood    and what changes need to be made .  Problem: Health Behavior/Discharge Planning: Goal: Ability to identify changes in lifestyle to reduce recurrence of condition will improve Outcome: Progressing Goal: Identification of resources available to assist in meeting health care needs will improve Outcome: Progressing   Problem: Physical Regulation: Goal: Complications related to the disease process, condition or treatment will be avoided or minimized Outcome: Progressing   Problem: Education: Goal: Knowledge of [facility] Education information/materials will improve Outcome: Progressing Goal: Emotional status will improve Outcome: Progressing Goal: Mental status will improve Outcome: Progressing Goal: Verbalization of understanding the information provided will improve Outcome: Progressing   Problem: Activity: Goal: Interest or engagement in activities will improve Outcome: Progressing Goal: Sleeping patterns will improve Outcome: Progressing   Problem: Health Behavior/Discharge Planning: Goal: Identification of resources available to assist in meeting health care needs will improve Outcome: Progressing Goal: Compliance with treatment plan for underlying cause of condition will improve Outcome: Progressing   Problem: Safety: Goal: Periods of time without injury will increase Outcome: Progressing   Problem: Education: Goal: Knowledge of General Education information will improve Outcome: Progressing

## 2018-03-03 NOTE — Tx Team (Signed)
Interdisciplinary Treatment and Diagnostic Plan Update  03/03/2018 Time of Session: 1100 James Mercado MRN: [id number]  Principal Diagnosis: Severe major depression, single episode, without psychotic features (HCC)  Secondary Diagnoses: Principal Problem:   Severe major depression, single episode, without psychotic features (HCC)   Current Medications:  Current Facility-Administered Medications  Medication Dose Route Frequency Provider Last Rate Last Dose  . acetaminophen (TYLENOL) tablet 650 mg  650 mg Oral Q6H PRN Clapacs, John T, MD      . alum & mag hydroxide-simeth (MAALOX/MYLANTA) 200-200-20 MG/5ML suspension 30 mL  30 mL Oral Q4H PRN Clapacs, John T, MD      . folic acid (FOLVITE) tablet 1 mg  1 mg Oral Daily Clapacs, Jackquline DenmarkJohn T, MD   1 mg at 03/03/18 0753  . hydrOXYzine (ATARAX/VISTARIL) tablet 50 mg  50 mg Oral TID PRN Clapacs, Jackquline DenmarkJohn T, MD   50 mg at 03/02/18 2128  . magnesium hydroxide (MILK OF MAGNESIA) suspension 30 mL  30 mL Oral Daily PRN Clapacs, John T, MD      . multivitamin with minerals tablet 1 tablet  1 tablet Oral Daily Clapacs, Jackquline DenmarkJohn T, MD   1 tablet at 03/03/18 0753  . nicotine (NICODERM CQ - dosed in mg/24 hours) patch 21 mg  21 mg Transdermal Daily Hessie Knows'Neal, Sarita, MD   21 mg at 03/03/18 0754  . sertraline (ZOLOFT) tablet 50 mg  50 mg Oral Daily Hessie Knows'Neal, Sarita, MD   50 mg at 03/03/18 0753  . thiamine (VITAMIN B-1) tablet 100 mg  100 mg Oral Daily Clapacs, John T, MD   100 mg at 03/03/18 0753   Or  . thiamine (B-1) injection 100 mg  100 mg Intravenous Daily Clapacs, John T, MD      . traZODone (DESYREL) tablet 150 mg  150 mg Oral QHS PRN Hessie Knows'Neal, Sarita, MD   150 mg at 03/02/18 2129   PTA Medications: No medications prior to admission.    Patient Stressors: Loss of significant relationship Substance abuse  Patient Strengths: Ability for insight Average or above average intelligence General fund of knowledge Motivation for treatment/growth Supportive  family/friends  Treatment Modalities: Medication Management, Group therapy, Case management,  1 to 1 session with clinician, Psychoeducation, Recreational therapy.   Physician Treatment Plan for Primary Diagnosis: Severe major depression, single episode, without psychotic features (HCC) Long Term Goal(s): Improvement in symptoms so as ready for discharge Improvement in symptoms so as ready for discharge   Short Term Goals: Ability to verbalize feelings will improve Ability to disclose and discuss suicidal ideas Ability to identify and develop effective coping behaviors will improve Ability to identify triggers associated with substance abuse/mental health issues will improve Ability to disclose and discuss suicidal ideas Compliance with prescribed medications will improve Ability to identify triggers associated with substance abuse/mental health issues will improve  Medication Management: Evaluate patient's response, side effects, and tolerance of medication regimen.  Therapeutic Interventions: 1 to 1 sessions, Unit Group sessions and Medication administration.  Evaluation of Outcomes: Progressing  Physician Treatment Plan for Secondary Diagnosis: Principal Problem:   Severe major depression, single episode, without psychotic features (HCC)  Long Term Goal(s): Improvement in symptoms so as ready for discharge Improvement in symptoms so as ready for discharge   Short Term Goals: Ability to verbalize feelings will improve Ability to disclose and discuss suicidal ideas Ability to identify and develop effective coping behaviors will improve Ability to identify triggers associated with substance abuse/mental health issues will improve Ability to  disclose and discuss suicidal ideas Compliance with prescribed medications will improve Ability to identify triggers associated with substance abuse/mental health issues will improve     Medication Management: Evaluate patient's response,  side effects, and tolerance of medication regimen.  Therapeutic Interventions: 1 to 1 sessions, Unit Group sessions and Medication administration.  Evaluation of Outcomes: Progressing   RN Treatment Plan for Primary Diagnosis: Severe major depression, single episode, without psychotic features (HCC) Long Term Goal(s): Knowledge of disease and therapeutic regimen to maintain health will improve  Short Term Goals: Ability to participate in decision making will improve, Ability to identify and develop effective coping behaviors will improve and Compliance with prescribed medications will improve  Medication Management: RN will administer medications as ordered by provider, will assess and evaluate patient's response and provide education to patient for prescribed medication. RN will report any adverse and/or side effects to prescribing provider.  Therapeutic Interventions: 1 on 1 counseling sessions, Psychoeducation, Medication administration, Evaluate responses to treatment, Monitor vital signs and CBGs as ordered, Perform/monitor CIWA, COWS, AIMS and Fall Risk screenings as ordered, Perform wound care treatments as ordered.  Evaluation of Outcomes: Progressing   LCSW Treatment Plan for Primary Diagnosis: Severe major depression, single episode, without psychotic features (HCC) Long Term Goal(s): Safe transition to appropriate next level of care at discharge, Engage patient in therapeutic group addressing interpersonal concerns.  Short Term Goals: Engage patient in aftercare planning with referrals and resources, Increase emotional regulation and Increase skills for wellness and recovery  Therapeutic Interventions: Assess for all discharge needs, 1 to 1 time with Social worker, Explore available resources and support systems, Assess for adequacy in community support network, Educate family and significant other(s) on suicide prevention, Complete Psychosocial Assessment, Interpersonal group  therapy.  Evaluation of Outcomes: Progressing   Progress in Treatment: Attending groups: Yes. Participating in groups: Yes. Taking medication as prescribed: Yes. Toleration medication: Yes. Family/Significant other contact made: No, will contact:  a support if pt provides consent. Patient understands diagnosis: Yes. Discussing patient identified problems/goals with staff: Yes. Medical problems stabilized or resolved: Yes. Denies suicidal/homicidal ideation: Yes. Issues/concerns per patient self-inventory: No. Other: None at this time.   New problem(s) identified: No, Describe:  none at this time.  New Short Term/Long Term Goal(s): Pt will report 0 SI by at least 48 hours prior to discharge and will practice at least two coping skills he can utilize to manage cravings/urges to use cocaine and alcohol.   Patient Goals:  Pt reports his goal for treatment is, "Try to learn to open up more, and work on my anger issues."   Discharge Plan or Barriers:  Pt will be discharged home and will continue tx in the outpatient setting.   Reason for Continuation of Hospitalization: Depression Medication stabilization  Estimated Length of Stay: 1-2   Recreational Therapy: Patient Stressors: Relationship  Patient Goal: Patient will identify 3 positive coping skills strategies to use for anger post d/c within 5 recreation therapy group sessions  Attendees: Patient: James Mercado 03/03/2018 11:26 AM  Physician: Dr. Flora Lipps, MD 03/03/2018 11:26 AM  Nursing: Hulan Amato, RN 03/03/2018 11:26 AM  RN Care Manager: 03/03/2018 11:26 AM  Social Worker: Heidi Dach, LCSW 03/03/2018 11:26 AM  Recreational Therapist: Garret Reddish, CTRS-LRT 03/03/2018 11:26 AM  Other: Johny Shears, LCSWA 03/03/2018 11:26 AM  Other: Jake Shark, LCSW 03/03/2018 11:26 AM  Other: 03/03/2018 11:26 AM    Scribe for Treatment Team: Heidi Dach, LCSW 03/03/2018 11:26 AM

## 2018-03-03 NOTE — Progress Notes (Signed)
Recreation Therapy Notes  Date: 03/03/2018  Time: 9:30 am  Location: Craft Room  Behavioral response: Appropriate    Intervention Topic: Creative Expression  Discussion/Intervention:   Group content on today was focused on creative expressions. The group defined creative expressions and ways they use creative expressions. Individual identified other positive ways creative expressions can be used and why it is important to express yourself. Patients participated in the intervention "expressive painting", where they had a chance to creatively express themselves.  Clinical Observations/Feedback:  Patient came to group and identified facial expressions, humor, dancing and singing as ways he creatively expresses himself to others. He was social with peers and staff while participating in group. Deaveon Schoen LRT/CTRS           Donatella Walski 03/03/2018 12:18 PM

## 2018-03-03 NOTE — [facility] Group Notes (Signed)
[facility] Group Notes:  (Nursing/MHT/Case Management/Adjunct)  Date:  03/03/2018  Time:  11:08 PM  Type of Therapy:  Group Therapy  Participation Level:  Active  Participation Quality:  Appropriate  Affect:  Appropriate  Cognitive:  Appropriate  Insight:  Appropriate  Engagement in Group:  Engaged  Modes of Intervention:  Discussion  Summary of Progress/Problems:  Burt EkJanice Marie Selam Pietsch 03/03/2018, 11:08 PM

## 2018-03-03 NOTE — Progress Notes (Addendum)
[facility]  03/03/2018 4:46 PM James Mercado  MRN:  [id number] Subjective:   Patient reports he's in a good mood.  He likes the combination of medication he's taking for his mood and sleep.  He reports the hydroxyzine seems to help calm him in the evening and allow him to rest more soundly.  Pt states his boss is being supportive and is going to help him with housing when he leaves.  Pt states in the interim, he plans to return to his friend's house he was staying at prior to hospitalization.   Patient denies SI, HI, AH, VH.  He reports readiness for discharge.  He's attending groups.   Pt will need 7 day supply of medication.   Principal Problem: Severe major depression, single episode, without psychotic features (HCC) Diagnosis:   Patient Active Problem List   Diagnosis Date Noted  . Suicide attempt (HCC) [T14.91XA] 02/25/2018  . Severe major depression, single episode, without psychotic features (HCC) [F32.2] 02/25/2018  . Alcohol abuse [F10.10] 02/25/2018  . Cocaine abuse [facility](HCC) [F14.10] 02/25/2018   Total Time spent with patient, talking with his treatment team, and reviewing patient's chart: 35 min  Past Psychiatric History: Patient denies ever being hospitalized in a psychiatric facility or detox facility.  Patient denies ever being on any psychotropic medications.  Patient reports that he attempted to overdose on Percocet 2-3 days prior to his strangulation attempt that led to this admission.  Patient also states that he attempted to hang himself from a tree at 32 years old and also attempted overdose on Percocet and OxyContin at 32 years old as well. Prior Inpatient Therapy:  none Prior Outpatient Therapy:  AA meetings    Past Medical History: History reviewed. No pertinent past medical history. History reviewed. No pertinent surgical history. Family History: History reviewed. No pertinent family history. Family Psychiatric  History: pt uncertain of family  psychiatric history   Social History:  Social History   Substance and Sexual Activity  Alcohol Use Yes     Social History   Substance and Sexual Activity  Drug Use Yes  . Types: Marijuana    Social History   Socioeconomic History  . Marital status: Single    Spouse name: Not on file  . Number of children: Not on file  . Years of education: Not on file  . Highest education level: Not on file  Occupational History  . Not on file  Social Needs  . Financial resource strain: Not on file  . Food insecurity:    Worry: Not on file    Inability: Not on file  . Transportation needs:    Medical: Not on file    Non-medical: Not on file  Tobacco Use  . Smoking status: Current Every Day Smoker    Packs/day: 1.00  . Smokeless tobacco: Never Used  Substance and Sexual Activity  . Alcohol use: Yes  . Drug use: Yes    Types: Marijuana  . Sexual activity: Not on file  Lifestyle  . Physical activity:    Days per week: Not on file    Minutes per session: Not on file  . Stress: Not on file  Relationships  . Social connections:    Talks on phone: Not on file    Gets together: Not on file    Attends religious service: Not on file    Active member of club or organization: Not on file    Attends meetings of clubs or organizations:  Not on file    Relationship status: Not on file  Other Topics Concern  . Not on file  Social History Narrative  . Not on file   Additional Social History:    Pain Medications: see PTA Prescriptions: see PTA Over the Counter: see PTA History of alcohol / drug use?: Yes Longest period of sobriety (when/how long): Unknown Negative Consequences of Use: Personal relationships     Patient reports that he is originally from [place], but moved to multiple states throughout his life.  Patient reports that he does not have a close relationship with his parents or siblings.  Patient has never been married.  He was in a relationship with the girlfriend of 8  months but she left approximately 1-2 weeks ago.  Patient states he got close to her minor children and misses them.  Patient reports that he graduated from high school.  He works in [employer].  Religion: none.  Hobbies: Auto body work and painting cars.  Patient currently lives with friends.  Patient has a 30 year old daughter that he has not seen since she was 32 years old.    Sleep: poor last night due to noise on unit  Appetite:  Fair  Current Medications: Current Facility-Administered Medications  Medication Dose Route Frequency Provider Last Rate Last Dose  . acetaminophen (TYLENOL) tablet 650 mg  650 mg Oral Q6H PRN Clapacs, John T, MD      . alum & mag hydroxide-simeth (MAALOX/MYLANTA) 200-200-20 MG/5ML suspension 30 mL  30 mL Oral Q4H PRN Clapacs, John T, MD      . folic acid (FOLVITE) tablet 1 mg  1 mg Oral Daily Clapacs, Jackquline Denmark, MD   1 mg at 03/03/18 0753  . hydrOXYzine (ATARAX/VISTARIL) tablet 50 mg  50 mg Oral TID PRN Clapacs, Jackquline Denmark, MD   50 mg at 03/02/18 2128  . magnesium hydroxide (MILK OF MAGNESIA) suspension 30 mL  30 mL Oral Daily PRN Clapacs, John T, MD      . multivitamin with minerals tablet 1 tablet  1 tablet Oral Daily Clapacs, Jackquline Denmark, MD   1 tablet at 03/03/18 0753  . nicotine (NICODERM CQ - dosed in mg/24 hours) patch 21 mg  21 mg Transdermal Daily Hessie Knows, MD   21 mg at 03/03/18 0754  . sertraline (ZOLOFT) tablet 50 mg  50 mg Oral Daily Hessie Knows, MD   50 mg at 03/03/18 0753  . thiamine (VITAMIN B-1) tablet 100 mg  100 mg Oral Daily Clapacs, John T, MD   100 mg at 03/03/18 0753   Or  . thiamine (B-1) injection 100 mg  100 mg Intravenous Daily Clapacs, John T, MD      . traZODone (DESYREL) tablet 150 mg  150 mg Oral QHS PRN Hessie Knows, MD   150 mg at 03/02/18 2129    Lab Results:  No results found for this or any previous visit (from the past 48 hour(s)).  Blood Alcohol level:  Lab Results  Component Value Date   ETH 161 (H) 02/24/2018     Metabolic Disorder Labs: Lab Results  Component Value Date   HGBA1C 5.6 02/26/2018   MPG 114.02 02/26/2018   No results found for: PROLACTIN Lab Results  Component Value Date   CHOL 150 02/26/2018   TRIG 118 02/26/2018   HDL 65 02/26/2018   CHOLHDL 2.3 02/26/2018   VLDL 24 02/26/2018   LDLCALC 61 02/26/2018    Physical Findings: AIMS: Facial and Oral  Movements Muscles of Facial Expression: None, normal Lips and Perioral Area: None, normal Jaw: None, normal Tongue: None, normal,Extremity Movements Upper (arms, wrists, hands, fingers): None, normal Lower (legs, knees, ankles, toes): None, normal, Trunk Movements Neck, shoulders, hips: None, normal, Overall Severity Severity of abnormal movements (highest score from questions above): None, normal Incapacitation due to abnormal movements: None, normal Patient's awareness of abnormal movements (rate only patient's report): No Awareness, Dental Status Current problems with teeth and/or dentures?: Yes Does patient usually wear dentures?: No  CIWA:  CIWA-Ar Total: 0 COWS:  COWS Total Score: 0  Musculoskeletal: Strength & Muscle Tone: within normal limits Gait & Station: normal Patient leans: N/A  Psychiatric Specialty Exam: Physical Exam  Nursing note and vitals reviewed.   ROS  Blood pressure 91/63, pulse 91, temperature (!) 97.4 F (36.3 C), temperature source Oral, resp. rate 16, height 6\' 2"  (1.88 m), weight 77.6 kg (171 lb), SpO2 100 %.Body mass index is 21.96 kg/m.  General Appearance: Casual and Neat, tattoos on upper and lower extremities and torso.  Eye Contact:  Good  Speech:  Normal Rate  Volume:  Normal  Mood:  "good"  Affect:  full range  Thought Process:  Coherent and Linear  Orientation:  Full (Time, Place, and Person)  Thought Content:  Logical  Suicidal Thoughts:  No  Homicidal Thoughts:  No  Memory:  intact  Judgement:  Fair  Insight:  Fair  Psychomotor Activity:  Normal  Concentration:   Concentration: Fair  Recall:  Fair  Fund of Knowledge:  Good  Language:  Good  Akathisia:  No  Handed:  Right  AIMS (if indicated):   N/A  Assets:  Communication Skills Desire for Improvement Financial Resources/Insurance Housing Resilience Transportation  ADL's:  Intact  Cognition:  WNL  Sleep:  Number of Hours: 7     Treatment Plan Summary: Daily contact with patient to assess and evaluate symptoms and progress in treatment, Medication management and Plan:  32 year old male admitted for substance abuse issues, depression, and suicide attempt via strangulation.  Patient has never been treated for depression.  He is open to antidepressant.  Patient also admits to drinking alcohol in excess for the past year.  He reports he drinks 12-24 beers per day for the past year.  Patient denies a history of delirium tremens.  Patient also endorses use of cocaine and intermittent use of opiates.  Patient also smokes marijuana.  Patient has never been in inpatient or outpatient treatment for illicit drug use or alcohol abuse, other than going to AA meetings with his friends. Patient is progressing with treatment.  Major depressive disorder, severe, without psychotic features-  Zoloft 50 mg daily.   Trazodone 150 mg nightly as needed insomnia hydroxyzine 50 mg PRN anxiety/sleep Supportive therapy provided  Alcohol use disorder, moderate to severe; cocaine use disorder, moderate; cannabis use disorder, moderate; opioid use disorder, mild -no active withdrawal symptoms -counseling provided  Nicotine use disorder -nicotine patch  Risk (including but not limited to serotonin syndrome, allergic reaction, anaphylaxis, death, priapism) and benefits discussed with patient he consents to the medications.    Observation Level/Precautions:  15 minute checks  Laboratory:  CBC, reviewed Chemistry Profile, within normal limits TSH 3.128 HbAIC 5.6 UDS + cocaine, THC Blood alcohol level  161 Lipid Panel total cholesterol 150, HDL 65, LDL 61, triglycerides 118 EKG-ordered  Psychotherapy: Group  Medications: See above  Consultations: N/A  Discharge services: [facility]   [facility] Discharge date: 7/23  Hessie Knows, MD 03/03/2018, 4:46 PM

## 2018-03-04 DIAGNOSIS — F322 Major depressive disorder, single episode, severe without psychotic features: Secondary | ICD-10-CM

## 2018-03-04 MED ORDER — ADULT MULTIVITAMIN W/MINERALS CH: 1.0000 | ORAL_TABLET | Freq: Every day | ORAL | Status: AC

## 2018-03-04 MED ORDER — THIAMINE HCL 100 MG PO TABS: 100.0000 mg | ORAL_TABLET | Freq: Every day | ORAL | 0 refills | Status: AC

## 2018-03-04 MED ORDER — FOLIC ACID 1 MG PO TABS
1.0000 mg | ORAL_TABLET | Freq: Every day | ORAL | Status: AC
Start: 2018-03-05 — End: ?

## 2018-03-04 NOTE — [facility] Group Notes (Signed)
03/04/2018 1PM  Type of Therapy/Topic:  Group Therapy:  Feelings about Diagnosis  Participation Level:  Active   Description of Group:   This group will allow patients to explore their thoughts and feelings about diagnoses they have received. Patients will be guided to explore their level of understanding and acceptance of these diagnoses. Facilitator will encourage patients to process their thoughts and feelings about the reactions of others to their diagnosis and will guide patients in identifying ways to discuss their diagnosis with significant others in their lives. This group will be process-oriented, with patients participating in exploration of their own experiences, giving and receiving support, and processing challenge from other group members.   Therapeutic Goals: 1. Patient will demonstrate understanding of diagnosis as evidenced by identifying two or more symptoms of the disorder 2. Patient will be able to express two feelings regarding the diagnosis 3. Patient will demonstrate their ability to communicate their needs through discussion and/or role play  Summary of Patient Progress: Actively and appropriately engaged in the group. Patient was able to provide support and validation to other group members.Patient practiced active listening when interacting with the facilitator and other group members. James Mercado spoke about how his depression causes him to use drugs and drink alcohol. "It causes me to do those things and those things cause me to be depressed." Patient is still in the process of obtaining treatment goals.        Therapeutic Modalities:   Cognitive Behavioral Therapy Brief Therapy Feelings Identification    Johny ShearsCassandra  Emmanuel Ercole, Alexander MtLCSW 03/04/2018 2:33 PM

## 2018-03-04 NOTE — Plan of Care (Addendum)
Patient found in common area upon my arrival. Patient is visible and social this evening. Mood and affect are bright. Cooperative with assessment. Denies all complaints including SI/HI/AVH. Patient is calm and polite. Reports eating and voiding adequately. Patient has no scheduled HS medication. Given Trazodone and Vistaril for sleep with positive results. Compliant with staff direction and unit routine. Q 15 minute checks maintained. Will continue to monitor throughout the shift. Patient slept 6.75 hours. No apparent distress. Will endorse care to oncoming shift.  Problem: Health Behavior/Discharge Planning: Goal: Ability to identify changes in lifestyle to reduce recurrence of condition will improve Outcome: Progressing Goal: Identification of resources available to assist in meeting health care needs will improve Outcome: Progressing   Problem: Physical Regulation: Goal: Complications related to the disease process, condition or treatment will be avoided or minimized Outcome: Progressing   Problem: Education: Goal: Knowledge of [facility] Education information/materials will improve Outcome: Progressing Goal: Emotional status will improve Outcome: Progressing Goal: Mental status will improve Outcome: Progressing Goal: Verbalization of understanding the information provided will improve Outcome: Progressing   Problem: Activity: Goal: Interest or engagement in activities will improve Outcome: Progressing Goal: Sleeping patterns will improve Outcome: Progressing   Problem: Health Behavior/Discharge Planning: Goal: Identification of resources available to assist in meeting health care needs will improve Outcome: Progressing Goal: Compliance with treatment plan for underlying cause of condition will improve Outcome: Progressing   Problem: Safety: Goal: Periods of time without injury will increase Outcome: Progressing   Problem: Education: Goal: Knowledge of General  Education information will improve Outcome: Progressing

## 2018-03-04 NOTE — Progress Notes (Signed)
Recreation Therapy Notes  INPATIENT RECREATION TR PLAN  Patient Details Name: James Mercado MRN: [id number] DOB: November 10, 1985 Today's Date: 03/04/2018  Rec Therapy Plan Is patient appropriate for Therapeutic Recreation?: Yes Treatment times per week: at least 3 Estimated Length of Stay: 5-7 days TR Treatment/Interventions: Group participation (Comment)  Discharge Criteria Pt will be discharged from therapy if:: Discharged Treatment plan/goals/alternatives discussed and agreed upon by:: Patient/family  Discharge Summary Short term goals set: Patient will identify 3 positive coping skills strategies to use for anger post d/c within 5 recreation therapy group sessions Short term goals met: Adequate for discharge Progress toward goals comments: Groups attended Which groups?: Leisure education, Goal setting, Other (Comment)(Creative expressions) Reason goals not met: N/A Therapeutic equipment acquired: N/A Reason patient discharged from therapy: Discharge from hospital Pt/family agrees with progress & goals achieved: Yes Date patient discharged from therapy: 03/04/18   Lizania Bouchard 03/04/2018, 4:24 PM

## 2018-03-04 NOTE — BHH Suicide Risk Assessment (Addendum)
[facility] Discharge Suicide Risk Assessment   Principal Problem: Severe major depression, single episode, without psychotic features [facility]) Discharge Diagnoses:  Patient Active Problem List   Diagnosis Date Noted  . Suicide attempt (HCC) [T14.91XA] 02/25/2018  . Severe major depression, single episode, without psychotic features (HCC) [F32.2] 02/25/2018  . Alcohol abuse [F10.10] 02/25/2018  . Cocaine abuse (HCC) [F14.10] 02/25/2018    Total Time spent with patient, reviewing chart, preparing discharge, and discussing patient with his treatment team: 40 min  Musculoskeletal: Strength & Muscle Tone: within normal limits Gait & Station: normal Patient leans: N/A  Psychiatric Specialty Exam: Review of Systems  Constitutional: Negative for chills and fever.  HENT: Positive for congestion. Negative for ear discharge, ear pain and sore throat.   Eyes: Negative for blurred vision, pain and redness.  Respiratory: Negative for cough, shortness of breath and wheezing.   Cardiovascular: Negative for chest pain and leg swelling.  Gastrointestinal: Negative for abdominal pain, constipation, diarrhea, nausea and vomiting.  Genitourinary: Negative for dysuria, frequency and urgency.  Musculoskeletal: Negative for myalgias.  Skin: Negative for itching and rash.  Neurological: Negative for dizziness, tremors, seizures, loss of consciousness, weakness and headaches.  Psychiatric/Behavioral: Negative for depression, hallucinations, memory loss and suicidal ideas. Substance abuse: patient states he will abstain alcohol use and illicit drug use. The patient is not nervous/anxious and does not have insomnia.     Blood pressure 106/63, pulse 73, temperature 98 F (36.7 C), resp. rate 18, height 6\' 2"  (1.88 m), weight 77.6 kg (171 lb), SpO2 100 %.Body mass index is 21.96 kg/m.  General Appearance: casual, well groom, hygiene good  Eye Contact::  Good  Speech:  Normal Rate409  Volume:  Normal  Mood:  "great"   Affect:  Full Range  Thought Process:  Coherent and Linear  Orientation:  Full (Time, Place, and Person)  Thought Content:  Logical  Suicidal Thoughts:  No, patient denies  Homicidal Thoughts:  No, patient denies  Memory:  intact  Judgement:  Fair  Insight:  Fair  Psychomotor Activity:  Normal  Concentration:  Good  Recall:  Good  Fund of Knowledge:Good  Language: Good  Akathisia:  No  Handed:  Right  AIMS (if indicated):   N/A  Assets:  Communication Skills Desire for Improvement Housing Physical Health Resilience Social Support Talents/Skills Transportation Vocational/Educational  Sleep:  Number of Hours: 6.75  Cognition: WNL  ADL's:  Intact   Mental Status Per Nursing Assessment::   On Admission:  NA  Demographic Factors:  Male, Caucasian, employed  Loss Factors: NA  Historical Factors: Prior suicide attempts  Risk Reduction Factors:   Employed, Living with another person, especially a relative, Positive social support and Positive coping skills or problem solving skills  Continued Clinical Symptoms:  Alcohol/Substance Abuse/Dependencies Previous Psychiatric Diagnoses and Treatments  Cognitive Features That Contribute To Risk:  None    Suicide Risk:  Mild:  Suicidal ideation of limited frequency, intensity, duration, and specificity.  There are no identifiable plans, no associated intent, mild dysphoria and related symptoms, good self-control (both objective and subjective assessment), few other risk factors, and identifiable protective factors, including available and accessible social support. Risk factors: Prior suicide attempts, history of depression, recent breakup with girlfriend, limited familial support, history of substance abuse Protective factors: Willingness to seek treatment, denial of active suicidal ideation, denial of homicidal suicidal ideation, patient denies access to or ownership of firearms; patient is employed, patient has social  support from his boss and friends, patient  reports good mood Acute suicide risk: low Chronic suicide risk: moderate  Crisis/safety plan: talk to friends (boss and boss' family)/family (sister and father), call 911, return to the Emergency department/hospital, call crisis hotlines, call outpatient providers   Discharge Medications: Medication Instructions   START taking these medications   START taking these medications   Morning Noon Evening Bedtime As Needed  folic acid 1 MG tablet  Commonly known as: FOLVITE  Take 1 tablet (1 mg total) by mouth daily.  Start taking on: 03/05/2018          hydrOXYzine 50 MG tablet  Commonly known as: ATARAX/VISTARIL  Take 1 tablet (50 mg total) by mouth at bedtime as needed for anxiety (anxiety/sleep).          multivitamin with minerals Tabs tablet  Take 1 tablet by mouth daily.  Start taking on: 03/05/2018          sertraline 50 MG tablet  Commonly known as: ZOLOFT  Take 1 tablet (50 mg total) by mouth daily.          thiamine 100 MG tablet  Take 1 tablet (100 mg total) by mouth daily.  Start taking on: 03/05/2018          traZODone 150 MG tablet  Commonly known as: DESYREL  Take 1 tablet (150 mg total) by mouth at bedtime as needed for sleep.                       Follow-up Information    Pc, [facility]. Go on 03/07/2018.   Why:  Please go to your hospital follow up appointment on Friday, 03/07/18 at 8:30AM. Thank you! Contact information: [address] [place] [address] [phone]           Plan Of Care/Follow-up recommendations:  Activity:  as tolerated Diet:  heart healthy Other:  continue to abstain from alcohol and illicit substances; go to hospital follow up appointment (see date and time above)  Hessie KnowsSarita O'Neal, MD 03/04/2018, 12:55 PM

## 2018-03-04 NOTE — Progress Notes (Signed)
  [facility] :  Will you be returning to the same living situation after discharge:  Yes,  returning home At discharge, do you have transportation home?: Yes,  friend Do you have the ability to pay for your medications: Yes,  income  Release of information consent forms completed and in the chart;  Patient's signature needed at discharge.  Patient to Follow up at: Follow-up Information    Pc, [facility]. Go on 03/07/2018.   Why:  Please go to your hospital follow up appointment on Friday, 03/07/18 at 8:30AM. Thank you! Contact information: [address] [place] [address] [phone]           Next level of care provider has [facility] Link:no  Safety Planning and Suicide Prevention discussed: Yes,  with pt and his friend  Have you used any form of tobacco in the last 30 days? (Cigarettes, Smokeless Tobacco, Cigars, and/or Pipes): Yes  Has patient been referred to the Quitline?: Patient refused referral  Patient has been referred for addiction treatment: Pt. refused referral  Heidi DachKelsey Elisandra Deshmukh, LCSW 03/04/2018, 9:30 AM

## 2018-03-04 NOTE — Discharge Summary (Signed)
Physician Discharge Summary Note  Patient:  James Mercado is an 32 y.o., male MRN:  [id number] DOB:  28-Mar-1986 Patient phone:  [phone] (home)  Patient address:   [address] [place] [address],   Total Time spent with patient, providing counseling, reviewing chart, preparing discharge, and discussing patient with his treatment team: 40 min     Date of Admission:  02/25/2018 Date of Discharge: 03/04/18  Reason for Admission:  Suicide attempt, depression, alcohol abuse  Principal Problem: Severe major depression, single episode, without psychotic features [facility]) Discharge Diagnoses: Patient Active Problem List   Diagnosis Date Noted  . Suicide attempt (HCC) [T14.91XA] 02/25/2018  . Severe major depression, single episode, without psychotic features (HCC) [F32.2] 02/25/2018  . Alcohol abuse [F10.10] 02/25/2018  . Cocaine abuse [facility]) [F14.10] 02/25/2018    Past Psychiatric History:  Patient denies ever being hospitalized in a psychiatric facility or detox facility.  Patient denies ever being on any psychotropic medications.  Patient reports that he attempted to overdose on Percocet 2-3 days prior to his strangulation attempt that led to this admission.  Patient also states that he attempted to hang himself from a tree at 32 years old and also attempted overdose on Percocet and OxyContin at 32 years old as well.    Past Medical History: History reviewed. No pertinent past medical history. History reviewed. No pertinent surgical history. Family History: History reviewed. No pertinent family history. Family Psychiatric  History: patient uncertain about family psychiatric history Social History:  Social History   Substance and Sexual Activity  Alcohol Use Yes     Social History   Substance and Sexual Activity  Drug Use Yes  . Types: Marijuana    Social History   Socioeconomic History  . Marital status: Single    Spouse name: Not on file  . Number of  children: Not on file  . Years of education: Not on file  . Highest education level: Not on file  Occupational History  . Not on file  Social Needs  . Financial resource strain: Not on file  . Food insecurity:    Worry: Not on file    Inability: Not on file  . Transportation needs:    Medical: Not on file    Non-medical: Not on file  Tobacco Use  . Smoking status: Current Every Day Smoker    Packs/day: 1.00  . Smokeless tobacco: Never Used  Substance and Sexual Activity  . Alcohol use: Yes  . Drug use: Yes    Types: Marijuana  . Sexual activity: Not on file  Lifestyle  . Physical activity:    Days per week: Not on file    Minutes per session: Not on file  . Stress: Not on file  Relationships  . Social connections:    Talks on phone: Not on file    Gets together: Not on file    Attends religious service: Not on file    Active member of club or organization: Not on file    Attends meetings of clubs or organizations: Not on file    Relationship status: Not on file  Other Topics Concern  . Not on file  Social History Narrative  . Not on file  Patient reports that he is originally from [place], but moved to multiple states throughout his life.  Patient reports that he does not have a close relationship with his parents or siblings.  Patient has never been married.  He was in a relationship  with the girlfriend of 8 months but she left approximately 1-2 weeks ago.  Patient states he got close to her minor children and misses them.  Patient reports that he graduated from high school.  He works in [employer].  Religion: none.  Hobbies: Auto body work and painting cars.  Patient currently lives with friends.  Patient has a 25 year old daughter that he has not seen since she was 30 years old.  Allergies:      Hospital Course:   Patient is a 32 yo male with history of depression, alcohol use disorder, and illicit substance abuse (intermittent cocaine, opioid, and THC use).   Patient was admitted after a suicide attempt via strangulation.  He admitted to tying his belt around his neck to end his life behind a local bar establishment.  Patient recalls passing out but was later found by another person.  Patient was brought to the ED for evaluation and treatment and subsequently admitted to the behavioral health hospital for treatment of his depression and suicide attempt.  Patient identified several stressors and triggers for his suicide attempt and depression, including his girlfriend of 8 months breaking up with him without warning and pt discussed strained familial relationships.  Patient was started on Zoloft for his depression and as needed trazodone for sleep.  He also utilized PRN hydroxyzine for sleep as well.  Patient responded favorably to the medications.  His mood improved and his sleep stabilized.    Prior to admission, the patient reported drinking 12-24 beers per day for the past year.  He was monitored via CIWA protocol.  Patient did not experience any signs or symptoms of alcohol withdrawal.  Patient attended groups and was provided individual counseling as well.  He was encouraged to abstain from alcohol and illicit substances.  He developed healthy coping skills and reported that he was going to start living healthier.  Patient was also counseled on smoking cessation.  Patient's suicidal ideation resolved and his mood improved.  He appears motivated to continue with treatment as an outpatient.  On day of discharge, the patient denied suicidal ideation, homicidal ideation, auditory hallucinations, visual hallucinations and paranoia.     Suicide Risk:  Mild:  Suicidal ideation of limited frequency, intensity, duration, and specificity.  There are no identifiable plans, no associated intent, mild dysphoria and related symptoms, good self-control (both objective and subjective assessment), few other risk factors, and identifiable protective factors, including available  and accessible social support. Risk factors:Prior suicide attempts, history of depression, recent breakup with girlfriend, limited familial support, history of substance abuse Protective factors: Willingness to seek treatment, denial of active suicidal ideation, denial of homicidal suicidal ideation, patient denies access to or ownership of firearms; patient is employed, patient has social support from his boss and friends, patient reports good mood Acute suicide risk: low Chronic suicide risk: moderate  Crisis/safety plan: talk to friends (boss and boss' family)/family (sister and father), call 911, return to the Emergency department/hospital, call crisis hotlines, call outpatient providers    Physical Findings: AIMS: Facial and Oral Movements Muscles of Facial Expression: None, normal Lips and Perioral Area: None, normal Jaw: None, normal Tongue: None, normal,Extremity Movements Upper (arms, wrists, hands, fingers): None, normal Lower (legs, knees, ankles, toes): None, normal, Trunk Movements Neck, shoulders, hips: None, normal, Overall Severity Severity of abnormal movements (highest score from questions above): None, normal Incapacitation due to abnormal movements: None, normal Patient's awareness of abnormal movements (rate only patient's report): No Awareness, Dental Status Current problems  with teeth and/or dentures?: Yes Does patient usually wear dentures?: No  CIWA:  CIWA-Ar Total: 0 COWS:  COWS Total Score: 0  Musculoskeletal: Strength & Muscle Tone: within normal limits Gait & Station: normal Patient leans: N/A    Psychiatric Specialty Exam: Physical Exam  Nursing note and vitals reviewed.    Review of Systems  Constitutional: Negative for chills and fever.  HENT: Positive for congestion. Negative for ear discharge, ear pain and sore throat.   Eyes: Negative for blurred vision, pain and redness.  Respiratory: Negative for cough, shortness of breath and  wheezing.   Cardiovascular: Negative for chest pain and leg swelling.  Gastrointestinal: Negative for abdominal pain, constipation, diarrhea, nausea and vomiting.  Genitourinary: Negative for dysuria, frequency and urgency.  Musculoskeletal: Negative for myalgias.  Skin: Negative for itching and rash.  Neurological: Negative for dizziness, tremors, seizures, loss of consciousness, weakness and headaches.  Psychiatric/Behavioral: Negative for depression, hallucinations, memory loss and suicidal ideas. Substance abuse: patient states he will abstain alcohol use and illicit drug use. The patient is not nervous/anxious and does not have insomnia.     Blood pressure 106/63, pulse 73, temperature 98 F (36.7 C), resp. rate 18, height 6\' 2"  (1.88 m), weight 77.6 kg (171 lb), SpO2 100 %.Body mass index is 21.96 kg/m.  General Appearance: casual, well groom, hygiene good  Eye Contact::  Good  Speech:  Normal Rate409  Volume:  Normal  Mood:  "great"  Affect:  Full Range  Thought Process:  Coherent and Linear  Orientation:  Full (Time, Place, and Person)  Thought Content:  Logical  Suicidal Thoughts:  No, patient denies  Homicidal Thoughts:  No, patient denies  Memory:  intact  Judgement:  Fair  Insight:  Fair  Psychomotor Activity:  Normal  Concentration:  Good  Recall:  Good  Fund of Knowledge:Good  Language: Good  Akathisia:  No  Handed:  Right  AIMS (if indicated):   N/A  Assets:  Communication Skills Desire for Improvement Housing Physical Health Resilience Social Support Talents/Skills Transportation Vocational/Educational  Sleep:  Number of Hours: 6.75  Cognition: WNL  ADL's:  Intact       Have you used any form of tobacco in the last 30 days? (Cigarettes, Smokeless Tobacco, Cigars, and/or Pipes): Yes  Has this patient used any form of tobacco in the last 30 days? (Cigarettes, Smokeless Tobacco, Cigars, and/or Pipes) Yes, Yes, A prescription for an FDA-approved  tobacco cessation medication was offered at discharge and the patient refused  Blood Alcohol level:  Lab Results  Component Value Date   ETH 161 (H) 02/24/2018    Metabolic Disorder Labs:  Lab Results  Component Value Date   HGBA1C 5.6 02/26/2018   MPG 114.02 02/26/2018   No results found for: PROLACTIN Lab Results  Component Value Date   CHOL 150 02/26/2018   TRIG 118 02/26/2018   HDL 65 02/26/2018   CHOLHDL 2.3 02/26/2018   VLDL 24 02/26/2018   LDLCALC 61 02/26/2018    See Psychiatric Specialty Exam and Suicide Risk Assessment completed by Attending Physician prior to discharge.  Discharge destination:  Home  Is patient on multiple antipsychotic therapies at discharge:  No     Discharge Instructions    Diet - low sodium heart healthy   Complete by:  As directed    Increase activity slowly   Complete by:  As directed      Allergies as of 03/04/2018      Reactions  Motrin [ibuprofen] Rash      Medication List    TAKE these medications     Indication  folic acid 1 MG tablet Commonly known as:  FOLVITE Take 1 tablet (1 mg total) by mouth daily. Start taking on:  03/05/2018  Indication:  vitamin supplementation   hydrOXYzine 50 MG tablet Commonly known as:  ATARAX/VISTARIL Take 1 tablet (50 mg total) by mouth at bedtime as needed for anxiety (anxiety/sleep).  Indication:  Feeling Anxious, anxiety/sleep   multivitamin with minerals Tabs tablet Take 1 tablet by mouth daily. Start taking on:  03/05/2018  Indication:  vitamin supplementation   sertraline 50 MG tablet Commonly known as:  ZOLOFT Take 1 tablet (50 mg total) by mouth daily.  Indication:  Major Depressive Disorder   thiamine 100 MG tablet Take 1 tablet (100 mg total) by mouth daily. Start taking on:  03/05/2018  Indication:  vitamin supplementation   traZODone 150 MG tablet Commonly known as:  DESYREL Take 1 tablet (150 mg total) by mouth at bedtime as needed for sleep.  Indication:   Trouble Sleeping      Risk (including but not limited to serotonin syndrome, allergic reaction, anaphylaxis, death, priapism) and benefits discussed with patient he consents to the medications.    Follow-up Information    Pc, [facility]. Go on 03/07/2018.   Why:  Please go to your hospital follow up appointment on Friday, 03/07/18 at 8:30AM. Thank you! Contact information: [address] [place] [address] [phone]           Plan Of Care/Follow-up recommendations:  Activity:  as tolerated Diet:  heart healthy Other:  continue to abstain from alcohol and illicit substances; go to hospital follow up appointment (see date and time above)    Signed: Hessie Knows, MD 03/04/2018, 9:55 PM

## 2018-03-04 NOTE — [facility] Group Notes (Signed)
[facility] Group Notes:  (Nursing/MHT/Case Management/Adjunct)  Date:  03/04/2018  Time:  2:44 PM  Type of Therapy:  Psychoeducational Skills  Participation Level:  Active  Participation Quality:  Appropriate, Attentive and Sharing  Affect:  Appropriate  Cognitive:  Alert and Appropriate  Insight:  Appropriate  Engagement in Group:  Engaged  Modes of Intervention:  Discussion, Education and Support  Summary of Progress/Problems:  James Mercado James Mercado James Mercado 03/04/2018, 2:44 PM 

## 2018-03-04 NOTE — Progress Notes (Signed)
Recreation Therapy Notes  Date: 03/04/2018  Time: 9:30 am   Location: Craft Room   Behavioral response: N/A   Intervention Topic: Values  Discussion/Intervention: Patient did not attend group.   Clinical Observations/Feedback:  Patient did not attend group.   Jerre Diguglielmo LRT/CTRS        Tiari Andringa 03/04/2018 10:21 AM 

## 2018-03-04 NOTE — [facility] Group Notes (Signed)
CSW Group Therapy Note  03/04/2018  Time:  0900  Type of Therapy and Topic: Group Therapy: Goals Group: SMART Goals    Participation Level:  Did Not Attend    Description of Group:   The purpose of a daily goals group is to assist and guide patients in setting recovery/wellness-related goals. The objective is to set goals as they relate to the crisis in which they were admitted. Patients will be using SMART goal modalities to set measurable goals. Characteristics of realistic goals will be discussed and patients will be assisted in setting and processing how one will reach their goal. Facilitator will also assist patients in applying interventions and coping skills learned in psycho-education groups to the SMART goal and process how one will achieve defined goal.    Therapeutic Goals:  -Patients will develop and document one goal related to or their crisis in which brought them into treatment.  -Patients will be guided by LCSW using SMART goal setting modality in how to set a measurable, attainable, realistic and time sensitive goal.  -Patients will process barriers in reaching goal.  -Patients will process interventions in how to overcome and successful in reaching goal.    Patient's Goal:  Pt was invited to attend group but chose not to attend. CSW will continue to encourage pt to attend group throughout their admission.    Therapeutic Modalities:  Motivational Interviewing  Cognitive Behavioral Therapy  Crisis Intervention Model  SMART goals setting  Heidi DachKelsey Abeer Deskins, MSW, LCSW Clinical Social Worker 03/04/2018 9:42 AM

## 2018-03-04 NOTE — Progress Notes (Signed)
Patient ID: James Mercado, male   DOB: [birth year], 32 y.o.   MRN: [id number]  Discharge Note:  Patient denies SI/HI/AVH at this time. Discharge instructions, AVS, transition record, and prescriptions gone over with patient. Patient agrees to comply with medication management, follow-up visit, and outpatient therapy. Patient belongings returned to patient. Patient questions and concerns addressed and answered. Patient ambulatory off unit. Patient discharged to home with his boss.

## 2018-03-09 ENCOUNTER — Emergency Department: Payer: Self-pay

## 2018-03-09 ENCOUNTER — Encounter: Payer: Self-pay | Admitting: Emergency Medicine

## 2018-03-09 ENCOUNTER — Emergency Department
Admission: EM | Admit: 2018-03-09 | Discharge: 2018-03-09 | Disposition: A | Discharge status: Home / Self Care | Payer: Self-pay | Attending: Emergency Medicine | Admitting: Emergency Medicine

## 2018-03-09 ENCOUNTER — Other Ambulatory Visit: Payer: Self-pay

## 2018-03-09 DIAGNOSIS — Z79899 Other long term (current) drug therapy: Secondary | ICD-10-CM | POA: Insufficient documentation

## 2018-03-09 DIAGNOSIS — F17210 Nicotine dependence, cigarettes, uncomplicated: Secondary | ICD-10-CM | POA: Insufficient documentation

## 2018-03-09 DIAGNOSIS — M545 Low back pain: Secondary | ICD-10-CM | POA: Insufficient documentation

## 2018-03-09 DIAGNOSIS — R51 Headache: Secondary | ICD-10-CM | POA: Insufficient documentation

## 2018-03-09 DIAGNOSIS — F1910 Other psychoactive substance abuse, uncomplicated: Secondary | ICD-10-CM | POA: Insufficient documentation

## 2018-03-09 DIAGNOSIS — F322 Major depressive disorder, single episode, severe without psychotic features: Secondary | ICD-10-CM | POA: Insufficient documentation

## 2018-03-09 DIAGNOSIS — R45851 Suicidal ideations: Secondary | ICD-10-CM | POA: Insufficient documentation

## 2018-03-09 LAB — COMPREHENSIVE METABOLIC PANEL
ALK PHOS: 54 U/L (ref 38–126)
ALT: 31 U/L (ref 0–44)
AST: 34 U/L (ref 15–41)
Albumin: 4.5 g/dL (ref 3.5–5.0)
Anion gap: 10 (ref 5–15)
BUN: 10 mg/dL (ref 6–20)
CHLORIDE: 106 mmol/L (ref 98–111)
CO2: 26 mmol/L (ref 22–32)
CREATININE: 0.78 mg/dL (ref 0.61–1.24)
Calcium: 9.1 mg/dL (ref 8.9–10.3)
GFR calc non Af Amer: >60 mL/min (ref >60)
Glucose, Bld: 97 mg/dL (ref 70–99)
Potassium: 3.9 mmol/L (ref 3.5–5.1)
SODIUM: 142 mmol/L (ref 135–145)
Total Bilirubin: 0.9 mg/dL (ref 0.3–1.2)
Total Protein: 7.4 g/dL (ref 6.5–8.1)

## 2018-03-09 LAB — URINE DRUG SCREEN, QUALITATIVE (ARMC ONLY)
Amphetamines, Ur Screen: NOT DETECTED
BARBITURATES, UR SCREEN: NOT DETECTED
Benzodiazepine, Ur Scrn: NOT DETECTED
CANNABINOID 50 NG, UR ~~LOC~~: POSITIVE — AB
COCAINE METABOLITE, UR ~~LOC~~: POSITIVE — AB
MDMA (ECSTASY) UR SCREEN: NOT DETECTED
Methadone Scn, Ur: NOT DETECTED
OPIATE, UR SCREEN: NOT DETECTED
PHENCYCLIDINE (PCP) UR S: NOT DETECTED
Tricyclic, Ur Screen: NOT DETECTED

## 2018-03-09 LAB — CBC
HEMATOCRIT: 47 % (ref 40.0–52.0)
HEMOGLOBIN: 16.2 g/dL (ref 13.0–18.0)
MCH: 31.9 pg (ref 26.0–34.0)
MCHC: 34.5 g/dL (ref 32.0–36.0)
MCV: 92.4 fL (ref 80.0–100.0)
Platelets: 339 10*3/uL (ref 150–440)
RBC: 5.08 MIL/uL (ref 4.40–5.90)
RDW: 13.5 % (ref 11.5–14.5)
WBC: 14.2 10*3/uL — ABNORMAL HIGH (ref 3.8–10.6)

## 2018-03-09 LAB — ACETAMINOPHEN LEVEL: Acetaminophen (Tylenol), Serum: <10 ug/mL — ABNORMAL LOW (ref 10–30)

## 2018-03-09 LAB — ETHANOL: Alcohol, Ethyl (B): 133 mg/dL — ABNORMAL HIGH (ref <10)

## 2018-03-09 LAB — SALICYLATE LEVEL

## 2018-03-09 MED ORDER — NICOTINE 21 MG/24HR TD PT24
21.0000 mg | MEDICATED_PATCH | Freq: Once | TRANSDERMAL | Status: DC
  Administered 2018-03-09: 21 mg via TRANSDERMAL
  Filled 2018-03-09: qty 1

## 2018-03-09 MED ORDER — OXYCODONE-ACETAMINOPHEN 5-325 MG PO TABS
2.0000 | ORAL_TABLET | Freq: Once | ORAL | Status: AC
  Administered 2018-03-09: 2 via ORAL
  Filled 2018-03-09: qty 2

## 2018-03-09 NOTE — ED Notes (Signed)
SOC called to check status of machine.  SOC machine ready when needed.

## 2018-03-09 NOTE — ED Notes (Signed)
Pt ambulated slowly to bathroom. Pt informed breakfast tray is in room.

## 2018-03-09 NOTE — ED Notes (Signed)
SOC called stated they had an emergent case, that needed to be done first, stated they would call back when available.  SOC ready when needed.

## 2018-03-09 NOTE — ED Notes (Signed)
Hourly rounding reveals patient sleeping in room. No complaints, stable, in no acute distress. Q15 minute rounds and monitoring via Security to continue. 

## 2018-03-09 NOTE — ED Provider Notes (Signed)
-----------------------------------------   3:35 PM on 03/09/2018 -----------------------------------------   Blood pressure 123/82, pulse 77, temperature 97.7 F (36.5 C), temperature source Oral, resp. rate 18, height 6\' 2"  (1.88 m), weight 77.1 kg (170 lb), SpO2 100 %.  Patient has been evaluated by psychiatry and cleared for discharge. IVC lifted by Dr. Alfonzo Beersharles WIndham. Patient's labs have been reviewed with no acute findings. Patient will be discharged at this time to home    Don PerkingVeronese, [place], MD 03/09/18 1536

## 2018-03-09 NOTE — ED Notes (Signed)
Pt dressed into behavioral scrubs, calm and cooperative, 1 bag
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
## 2018-03-09 NOTE — ED Notes (Signed)
Pt. Taken to CT with security escort.

## 2018-03-09 NOTE — ED Notes (Signed)
Pt given cup of water 

## 2018-03-09 NOTE — ED Notes (Signed)
Patient discharged home went by self, patient received discharge papers. Patient received belongings and verbalized he has received all of his belongings. Patient appropriate and cooperative, Denies SI/HI AVH. Vital signs taken. NAD noted.
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
## 2018-03-09 NOTE — ED Notes (Signed)
Pt sleeping. Breakfast tray has been ordered.

## 2018-03-09 NOTE — ED Notes (Signed)
Pt. Returned from CT via wheelchair and security, no issues.

## 2018-03-09 NOTE — Discharge Instructions (Signed)
You have been seen in the Emergency Department (ED)  today for a psychiatric complaint.  You have been evaluated by psychiatry and we believe you are safe to be discharged from the hospital.   ° °Please return to the Emergency Department (ED)  immediately if you have ANY thoughts of hurting yourself or anyone else, so that we may help you. ° °Please avoid alcohol and drug use. ° °Follow up with your doctor and/or therapist as soon as possible regarding today's ED  visit.  ° °You may call crisis hotline for [place] at [phone]. ° °
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
